# Patient Record
Sex: Male | Born: 1975 | Race: White | Hispanic: No | Marital: Married | Smoking: Current every day smoker
Health system: Southern US, Community
[De-identification: ages and names within clinical notes are randomized; demographics above are authoritative.]

## PROBLEM LIST (undated history)

## (undated) DIAGNOSIS — R059 Cough, unspecified: Secondary | ICD-10-CM

## (undated) DIAGNOSIS — Z8719 Personal history of other diseases of the digestive system: Secondary | ICD-10-CM

## (undated) DIAGNOSIS — R12 Heartburn: Secondary | ICD-10-CM

## (undated) DIAGNOSIS — M1990 Unspecified osteoarthritis, unspecified site: Secondary | ICD-10-CM

## (undated) DIAGNOSIS — R05 Cough: Secondary | ICD-10-CM

## (undated) DIAGNOSIS — R0600 Dyspnea, unspecified: Secondary | ICD-10-CM

## (undated) DIAGNOSIS — S72009A Fracture of unspecified part of neck of unspecified femur, initial encounter for closed fracture: Secondary | ICD-10-CM

## (undated) DIAGNOSIS — I10 Essential (primary) hypertension: Secondary | ICD-10-CM

## (undated) HISTORY — PX: WRIST SURGERY: SHX841

## (undated) HISTORY — PX: HERNIA REPAIR: SHX51

---

## 2017-09-15 DIAGNOSIS — M1612 Unilateral primary osteoarthritis, left hip: Secondary | ICD-10-CM | POA: Diagnosis not present

## 2017-10-27 DIAGNOSIS — M1612 Unilateral primary osteoarthritis, left hip: Secondary | ICD-10-CM | POA: Diagnosis not present

## 2017-11-03 DIAGNOSIS — R030 Elevated blood-pressure reading, without diagnosis of hypertension: Secondary | ICD-10-CM | POA: Diagnosis not present

## 2017-11-03 DIAGNOSIS — Z01818 Encounter for other preprocedural examination: Secondary | ICD-10-CM | POA: Diagnosis not present

## 2017-11-03 DIAGNOSIS — M25552 Pain in left hip: Secondary | ICD-10-CM | POA: Diagnosis not present

## 2017-11-03 DIAGNOSIS — F17200 Nicotine dependence, unspecified, uncomplicated: Secondary | ICD-10-CM | POA: Diagnosis not present

## 2017-11-17 DIAGNOSIS — M1612 Unilateral primary osteoarthritis, left hip: Secondary | ICD-10-CM | POA: Diagnosis not present

## 2017-11-18 DIAGNOSIS — M25552 Pain in left hip: Secondary | ICD-10-CM | POA: Diagnosis not present

## 2017-11-18 DIAGNOSIS — E871 Hypo-osmolality and hyponatremia: Secondary | ICD-10-CM | POA: Diagnosis not present

## 2017-11-18 DIAGNOSIS — F17200 Nicotine dependence, unspecified, uncomplicated: Secondary | ICD-10-CM | POA: Diagnosis not present

## 2017-11-18 DIAGNOSIS — I10 Essential (primary) hypertension: Secondary | ICD-10-CM | POA: Diagnosis not present

## 2017-11-23 ENCOUNTER — Ambulatory Visit: Payer: Self-pay | Admitting: Physician Assistant

## 2017-11-23 NOTE — H&P (Signed)
TOTAL HIP ADMISSION H&P  Patient is admitted for left total hip arthroplasty.  Subjective:  Chief Complaint: left hip pain  HPI: Andrew Lamb, 42 y.o. male, has a history of pain and functional disability in the left hip(s) due to arthritis and patient has failed non-surgical conservative treatments for greater than 12 weeks to include NSAID's and/or analgesics, corticosteriod injections, use of assistive devices and activity modification.  Onset of symptoms was gradual starting 5 years ago with gradually worsening course since that time.The patient noted no past surgery on the left hip(s).  Patient currently rates pain in the left hip at 10 out of 10 with activity. Patient has night pain, worsening of pain with activity and weight bearing, pain that interfers with activities of daily living and pain with passive range of motion. Patient has evidence of subchondral cysts, periarticular osteophytes and joint space narrowing by imaging studies. This condition presents safety issues increasing the risk of falls. There is no current active infection.  There are no active problems to display for this patient.  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   No current facility-administered medications for this visit.    Allergies not on file  Social History   Tobacco Use  . Smoking status: Not on file  Substance Use Topics  . Alcohol use: Not on file    No family history on file.   Review of Systems  Musculoskeletal: Positive for joint pain.  All other systems reviewed and are negative.   Objective:  Physical Exam  Constitutional: He is oriented to person, place, and time. He appears well-developed and well-nourished. No distress.  HENT:  Head: Normocephalic and atraumatic.  Nose: Nose normal.  Eyes: Pupils are equal, round, and reactive to light.  Conjunctivae and EOM are normal.  Neck: Normal range of motion. Neck supple.  Cardiovascular: Normal rate, regular rhythm, normal heart sounds and intact distal pulses.  Respiratory: Effort normal and breath sounds normal. No respiratory distress. He has no wheezes.  GI: Soft. Bowel sounds are normal. He exhibits no distension. There is no tenderness.  Musculoskeletal:       Left hip: He exhibits decreased range of motion, decreased strength, tenderness and bony tenderness.  Lymphadenopathy:    He has no cervical adenopathy.  Neurological: He is alert and oriented to person, place, and time. No cranial nerve deficit.  Skin: Skin is warm and dry. No rash noted. No erythema.  Psychiatric: He has a normal mood and affect. His behavior is normal.    Vital signs in last 24 hours: @VSRANGES @  Labs:   There is no height or weight on file to calculate BMI.   Imaging Review Plain radiographs demonstrate severe degenerative joint disease of the left hip(s). The bone quality appears to be good for age and reported activity level.    Preoperative templating of the joint replacement has been completed, documented, and submitted to the Operating Room personnel in order to optimize intra-operative equipment management.       Assessment/Plan:  End stage arthritis, left hip(s)  The patient history, physical examination, clinical judgement of the provider and imaging studies are consistent with end stage degenerative joint disease of the left hip(s) and total hip arthroplasty is deemed medically necessary. The treatment options including medical management, injection therapy, arthroscopy and arthroplasty were discussed at length. The risks and benefits  of total hip arthroplasty were presented and reviewed. The risks due to aseptic loosening, infection, stiffness, dislocation/subluxation,  thromboembolic complications and other imponderables were discussed.  The patient acknowledged the  explanation, agreed to proceed with the plan and consent was signed. Patient is being admitted for inpatient treatment for surgery, pain control, PT, OT, prophylactic antibiotics, VTE prophylaxis, progressive ambulation and ADL's and discharge planning.The patient is planning to be discharged home with home health services

## 2017-11-23 NOTE — H&P (View-Only) (Signed)
TOTAL HIP ADMISSION H&P  Patient is admitted for left total hip arthroplasty.  Subjective:  Chief Complaint: left hip pain  HPI: Andrew Lamb, 42 y.o. male, has a history of pain and functional disability in the left hip(s) due to arthritis and patient has failed non-surgical conservative treatments for greater than 12 weeks to include NSAID's and/or analgesics, corticosteriod injections, use of assistive devices and activity modification.  Onset of symptoms was gradual starting 5 years ago with gradually worsening course since that time.The patient noted no past surgery on the left hip(s).  Patient currently rates pain in the left hip at 10 out of 10 with activity. Patient has night pain, worsening of pain with activity and weight bearing, pain that interfers with activities of daily living and pain with passive range of motion. Patient has evidence of subchondral cysts, periarticular osteophytes and joint space narrowing by imaging studies. This condition presents safety issues increasing the risk of falls. There is no current active infection.  There are no active problems to display for this patient.  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   No current facility-administered medications for this visit.    Allergies not on file  Social History   Tobacco Use  . Smoking status: Not on file  Substance Use Topics  . Alcohol use: Not on file    No family history on file.   Review of Systems  Musculoskeletal: Positive for joint pain.  All other systems reviewed and are negative.   Objective:  Physical Exam  Constitutional: He is oriented to person, place, and time. He appears well-developed and well-nourished. No distress.  HENT:  Head: Normocephalic and atraumatic.  Nose: Nose normal.  Eyes: Pupils are equal, round, and reactive to light.  Conjunctivae and EOM are normal.  Neck: Normal range of motion. Neck supple.  Cardiovascular: Normal rate, regular rhythm, normal heart sounds and intact distal pulses.  Respiratory: Effort normal and breath sounds normal. No respiratory distress. He has no wheezes.  GI: Soft. Bowel sounds are normal. He exhibits no distension. There is no tenderness.  Musculoskeletal:       Left hip: He exhibits decreased range of motion, decreased strength, tenderness and bony tenderness.  Lymphadenopathy:    He has no cervical adenopathy.  Neurological: He is alert and oriented to person, place, and time. No cranial nerve deficit.  Skin: Skin is warm and dry. No rash noted. No erythema.  Psychiatric: He has a normal mood and affect. His behavior is normal.    Vital signs in last 24 hours: @VSRANGES@  Labs:   There is no height or weight on file to calculate BMI.   Imaging Review Plain radiographs demonstrate severe degenerative joint disease of the left hip(s). The bone quality appears to be good for age and reported activity level.    Preoperative templating of the joint replacement has been completed, documented, and submitted to the Operating Room personnel in order to optimize intra-operative equipment management.       Assessment/Plan:  End stage arthritis, left hip(s)  The patient history, physical examination, clinical judgement of the provider and imaging studies are consistent with end stage degenerative joint disease of the left hip(s) and total hip arthroplasty is deemed medically necessary. The treatment options including medical management, injection therapy, arthroscopy and arthroplasty were discussed at length. The risks and benefits   of total hip arthroplasty were presented and reviewed. The risks due to aseptic loosening, infection, stiffness, dislocation/subluxation,  thromboembolic complications and other imponderables were discussed.  The patient acknowledged the  explanation, agreed to proceed with the plan and consent was signed. Patient is being admitted for inpatient treatment for surgery, pain control, PT, OT, prophylactic antibiotics, VTE prophylaxis, progressive ambulation and ADL's and discharge planning.The patient is planning to be discharged home with home health services 

## 2017-11-27 NOTE — Pre-Procedure Instructions (Signed)
Gaye AlkenMartin Splawn  11/27/2017     No Pharmacies Listed   Your procedure is scheduled on 12-11-2017 Friday  .  [facility] at 8:15 A.M.   Call this number if you have problems the morning of surgery:  [phone]   Remember:  Do not eat food or drink liquids after midnight.   Take these medicines the morning of surgery with A SIP OF WATER  None    STOP TAKING ANY ASPIRIN(UNLESS OTHERWISE INSTRUCTED BY YOUR SURGEON),ANTIINFLAMATORIES (IBUPROFEN,ALEVE,MOTRIN,ADVIL,GOODY'S POWDERS),HERBAL SUPPLEMENTS,FISH OIL,AND VITAMINS 5-7 DAYS PRIOR TO SURGERY   Do not wear jewelry, make-up or nail polish.  Do not wear lotions, powders, or perfumes, or deodorant.  Do not shave 48 hours prior to surgery.     Do not bring valuables to the hospital.   [facility] any belongings or valuables.  Contacts, dentures or bridgework may not be worn into surgery.  Leave your suitcase in the car.  After surgery it may be brought to your room.  For patients admitted to the hospital, discharge time will be determined by your treatment team.  Patients discharged the day of surgery will not be allowed to drive home.    Special Instructions: [place] - Preparing for Surgery  Before surgery, you can play an important role.  Because skin is not sterile, your skin needs to be as free of germs as possible.  You can reduce the number of germs on you skin by washing with CHG (chlorahexidine gluconate) soap before surgery.  CHG is an antiseptic cleaner which kills germs and bonds with the skin to continue killing germs even after washing.  Please DO NOT use if you have an allergy to CHG or antibacterial soaps.  If your skin becomes reddened/irritated stop using the CHG and inform your nurse when you arrive at Short Stay.  Do not shave (including legs and underarms) for at least 48 hours prior to the first CHG shower.  You may shave your face.  Please follow  these instructions carefully:   1.  Shower with CHG Soap the night before surgery and the   morning of Surgery.  2.  If you choose to wash your hair, wash your hair first as usual with your normal shampoo.  3.  After you shampoo, rinse your hair and body thoroughly to remove the  Shampoo.  4.  Use CHG as you would any other liquid soap.  You can apply chg directly  to the skin and wash gently with scrungie or a clean washcloth.  5.  Apply the CHG Soap to your body ONLY FROM THE NECK DOWN.   Do not use on open wounds or open sores.  Avoid contact with your eyes,  ears, mouth and genitals (private parts).  Wash genitals (private parts) with your normal soap.  6.  Wash thoroughly, paying special attention to the area where your surgery will be performed.  7.  Thoroughly rinse your body with warm water from the neck down.  8.  DO NOT shower/wash with your normal soap after using and rinsing o  the CHG Soap.  9.  Pat yourself dry with a clean towel.            10.  Wear clean pajamas.            11.  Place clean sheets on your bed the night of your first shower and do not sleep with pets.  Day  of Surgery  Do not apply any lotions/deodorants the morning of surgery.  Please wear clean clothes to the hospital/surgery center. Special Instructions: [place] - Preparing for Surgery  Before surgery, you can play an important role.  Because skin is not sterile, your skin needs to be as free of germs as possible.  You can reduce the number of germs on you skin by washing with CHG (chlorahexidine gluconate) soap before surgery.  CHG is an antiseptic cleaner which kills germs and bonds with the skin to continue killing germs even after washing.  Please DO NOT use if you have an allergy to CHG or antibacterial soaps.  If your skin becomes reddened/irritated stop using the CHG and inform your nurse when you arrive at Short Stay.  Do not shave (including legs and underarms) for at least 48 hours prior to the  first CHG shower.  You may shave your face.  Please follow these instructions carefully:   1.  Shower with CHG Soap the night before surgery and the   morning of Surgery.  2.  If you choose to wash your hair, wash your hair first as usual with your normal shampoo.  3.  After you shampoo, rinse your hair and body thoroughly to remove the  Shampoo.  4.  Use CHG as you would any other liquid soap.  You can apply chg directly  to the skin and wash gently with scrungie or a clean washcloth.  5.  Apply the CHG Soap to your body ONLY FROM THE NECK DOWN.   Do not use on open wounds or open sores.  Avoid contact with your eyes,  ears, mouth and genitals (private parts).  Wash genitals (private parts) with your normal soap.  6.  Wash thoroughly, paying special attention to the area where your surgery will be performed.  7.  Thoroughly rinse your body with warm water from the neck down.  8.  DO NOT shower/wash with your normal soap after using and rinsing o  the CHG Soap.  9.  Pat yourself dry with a clean towel.            10.  Wear clean pajamas.            11.  Place clean sheets on your bed the night of your first shower and do not sleep with pets.  Day of Surgery  Do not apply any lotions/deodorants the morning of surgery.  Please wear clean clothes to the hospital/surgery center.   Please read over the following fact sheets that you were given. Coughing and Deep Breathing, MRSA Information and Surgical Site Infection Prevention. Incentive Spirometry

## 2017-11-30 ENCOUNTER — Encounter (HOSPITAL_COMMUNITY): Payer: Self-pay

## 2017-11-30 ENCOUNTER — Encounter (HOSPITAL_COMMUNITY)
Admission: RE | Admit: 2017-11-30 | Discharge: 2017-11-30 | Disposition: A | Discharge status: Home / Self Care | Payer: BLUE CROSS/BLUE SHIELD | Source: Ambulatory Visit | Attending: Orthopedic Surgery | Admitting: Orthopedic Surgery

## 2017-11-30 DIAGNOSIS — Z01812 Encounter for preprocedural laboratory examination: Secondary | ICD-10-CM | POA: Insufficient documentation

## 2017-11-30 HISTORY — DX: Cough, unspecified: R05.9

## 2017-11-30 HISTORY — DX: Dyspnea, unspecified: R06.00

## 2017-11-30 HISTORY — DX: Cough: R05

## 2017-11-30 HISTORY — DX: Fracture of unspecified part of neck of unspecified femur, initial encounter for closed fracture: S72.009A

## 2017-11-30 HISTORY — DX: Essential (primary) hypertension: I10

## 2017-11-30 LAB — COMPREHENSIVE METABOLIC PANEL
ALT: 19 U/L (ref 17–63)
ANION GAP: 11 (ref 5–15)
AST: 30 U/L (ref 15–41)
Albumin: 4.2 g/dL (ref 3.5–5.0)
Alkaline Phosphatase: 94 U/L (ref 38–126)
BILIRUBIN TOTAL: 0.9 mg/dL (ref 0.3–1.2)
BUN: 9 mg/dL (ref 6–20)
CHLORIDE: 98 mmol/L — AB (ref 101–111)
CO2: 25 mmol/L (ref 22–32)
Calcium: 9.3 mg/dL (ref 8.9–10.3)
Creatinine, Ser: 0.84 mg/dL (ref 0.61–1.24)
Glucose, Bld: 111 mg/dL — ABNORMAL HIGH (ref 65–99)
POTASSIUM: 3.5 mmol/L (ref 3.5–5.1)
Sodium: 134 mmol/L — ABNORMAL LOW (ref 135–145)
TOTAL PROTEIN: 7.8 g/dL (ref 6.5–8.1)

## 2017-11-30 LAB — TYPE AND SCREEN
ABO/RH(D): A POS
Antibody Screen: NEGATIVE

## 2017-11-30 LAB — CBC WITH DIFFERENTIAL/PLATELET
Basophils Absolute: 0 10*3/uL (ref 0.0–0.1)
Basophils Relative: 0 %
EOS PCT: 1 %
Eosinophils Absolute: 0 10*3/uL (ref 0.0–0.7)
HEMATOCRIT: 45.1 % (ref 39.0–52.0)
Hemoglobin: 16 g/dL (ref 13.0–17.0)
LYMPHS PCT: 24 %
Lymphs Abs: 1.5 10*3/uL (ref 0.7–4.0)
MCH: 34 pg (ref 26.0–34.0)
MCHC: 35.5 g/dL (ref 30.0–36.0)
MCV: 95.8 fL (ref 78.0–100.0)
MONO ABS: 0.5 10*3/uL (ref 0.1–1.0)
MONOS PCT: 8 %
NEUTROS ABS: 4.1 10*3/uL (ref 1.7–7.7)
NEUTROS PCT: 67 %
PLATELETS: 214 10*3/uL (ref 150–400)
RBC: 4.71 MIL/uL (ref 4.22–5.81)
RDW: 13.6 % (ref 11.5–15.5)
WBC: 6.2 10*3/uL (ref 4.0–10.5)

## 2017-11-30 LAB — APTT: aPTT: 29 seconds (ref 24–36)

## 2017-11-30 LAB — URINALYSIS, ROUTINE W REFLEX MICROSCOPIC
Glucose, UA: NEGATIVE mg/dL
HGB URINE DIPSTICK: NEGATIVE
KETONES UR: 5 mg/dL — AB
Leukocytes, UA: NEGATIVE
Nitrite: NEGATIVE
PH: 5 (ref 5.0–8.0)
Protein, ur: 100 mg/dL — AB
Specific Gravity, Urine: 1.026 (ref 1.005–1.030)

## 2017-11-30 LAB — PROTIME-INR
INR: 0.91
Prothrombin Time: 12.2 seconds (ref 11.4–15.2)

## 2017-11-30 LAB — ABO/RH: ABO/RH(D): A POS

## 2017-11-30 LAB — SURGICAL PCR SCREEN
MRSA, PCR: NEGATIVE
Staphylococcus aureus: POSITIVE — AB

## 2017-12-01 LAB — URINE CULTURE: Culture: NO GROWTH

## 2017-12-10 MED ORDER — BUPIVACAINE LIPOSOME 1.3 % IJ SUSP
20.0000 mL | INTRAMUSCULAR | Status: DC
  Filled 2017-12-10: qty 20

## 2017-12-10 MED ORDER — SODIUM CHLORIDE 0.9 % IV SOLN: INTRAVENOUS | Status: DC

## 2017-12-10 MED ORDER — TRANEXAMIC ACID 1000 MG/10ML IV SOLN
1000.0000 mg | INTRAVENOUS | Status: AC
  Administered 2017-12-11: 1000 mg via INTRAVENOUS
  Filled 2017-12-10: qty 1100

## 2017-12-11 ENCOUNTER — Inpatient Hospital Stay (HOSPITAL_COMMUNITY): Payer: BLUE CROSS/BLUE SHIELD

## 2017-12-11 ENCOUNTER — Encounter (HOSPITAL_COMMUNITY): Payer: Self-pay

## 2017-12-11 ENCOUNTER — Inpatient Hospital Stay (HOSPITAL_COMMUNITY): Payer: BLUE CROSS/BLUE SHIELD | Admitting: Anesthesiology

## 2017-12-11 ENCOUNTER — Inpatient Hospital Stay (HOSPITAL_COMMUNITY): Payer: BLUE CROSS/BLUE SHIELD | Admitting: Emergency Medicine

## 2017-12-11 ENCOUNTER — Inpatient Hospital Stay (HOSPITAL_COMMUNITY)
Admission: RE | Admit: 2017-12-11 | Discharge: 2017-12-12 | DRG: 470 | Disposition: A | Discharge status: Home Health | Payer: BLUE CROSS/BLUE SHIELD | Source: Ambulatory Visit | Attending: Orthopedic Surgery | Admitting: Orthopedic Surgery

## 2017-12-11 ENCOUNTER — Encounter (HOSPITAL_COMMUNITY)
Admission: RE | Disposition: A | Discharge status: Home Health | Payer: Self-pay | Source: Ambulatory Visit | Attending: Orthopedic Surgery

## 2017-12-11 DIAGNOSIS — M1612 Unilateral primary osteoarthritis, left hip: Principal | ICD-10-CM | POA: Diagnosis present

## 2017-12-11 DIAGNOSIS — I10 Essential (primary) hypertension: Secondary | ICD-10-CM | POA: Diagnosis not present

## 2017-12-11 DIAGNOSIS — Z471 Aftercare following joint replacement surgery: Secondary | ICD-10-CM | POA: Diagnosis not present

## 2017-12-11 DIAGNOSIS — Z880 Allergy status to penicillin: Secondary | ICD-10-CM

## 2017-12-11 DIAGNOSIS — F17200 Nicotine dependence, unspecified, uncomplicated: Secondary | ICD-10-CM | POA: Diagnosis present

## 2017-12-11 DIAGNOSIS — R269 Unspecified abnormalities of gait and mobility: Secondary | ICD-10-CM | POA: Diagnosis not present

## 2017-12-11 DIAGNOSIS — M25552 Pain in left hip: Secondary | ICD-10-CM | POA: Diagnosis not present

## 2017-12-11 DIAGNOSIS — M879 Osteonecrosis, unspecified: Secondary | ICD-10-CM | POA: Diagnosis not present

## 2017-12-11 DIAGNOSIS — M6588 Other synovitis and tenosynovitis, other site: Secondary | ICD-10-CM | POA: Diagnosis not present

## 2017-12-11 DIAGNOSIS — Z96642 Presence of left artificial hip joint: Secondary | ICD-10-CM | POA: Diagnosis not present

## 2017-12-11 DIAGNOSIS — M87052 Idiopathic aseptic necrosis of left femur: Secondary | ICD-10-CM | POA: Diagnosis not present

## 2017-12-11 HISTORY — PX: TOTAL HIP ARTHROPLASTY: SHX124

## 2017-12-11 SURGERY — ARTHROPLASTY, HIP, TOTAL,POSTERIOR APPROACH
Anesthesia: General | Site: Hip | Laterality: Left

## 2017-12-11 MED ORDER — FLEET ENEMA 7-19 GM/118ML RE ENEM: 1.0000 | ENEMA | Freq: Once | RECTAL | Status: DC | PRN

## 2017-12-11 MED ORDER — ONDANSETRON HCL 4 MG/2ML IJ SOLN
INTRAMUSCULAR | Status: DC | PRN
  Administered 2017-12-11: 4 mg via INTRAVENOUS

## 2017-12-11 MED ORDER — SODIUM CHLORIDE 0.9 % IV SOLN
INTRAVENOUS | Status: DC
  Administered 2017-12-11: 18:00:00 via INTRAVENOUS

## 2017-12-11 MED ORDER — HYDROMORPHONE HCL 2 MG/ML IJ SOLN
0.2500 mg | INTRAMUSCULAR | Status: DC | PRN
  Administered 2017-12-11 (×4): 0.5 mg via INTRAVENOUS

## 2017-12-11 MED ORDER — PROPOFOL 500 MG/50ML IV EMUL
INTRAVENOUS | Status: DC | PRN
  Administered 2017-12-11: 25 ug/kg/min via INTRAVENOUS

## 2017-12-11 MED ORDER — AMLODIPINE BESYLATE 10 MG PO TABS
10.0000 mg | ORAL_TABLET | Freq: Every day | ORAL | Status: DC
  Administered 2017-12-12: 10 mg via ORAL
  Filled 2017-12-11: qty 1

## 2017-12-11 MED ORDER — VANCOMYCIN HCL IN DEXTROSE 1-5 GM/200ML-% IV SOLN
INTRAVENOUS | Status: AC
  Administered 2017-12-11: 1000 mg via INTRAVENOUS
  Filled 2017-12-11: qty 200

## 2017-12-11 MED ORDER — FENTANYL CITRATE (PF) 250 MCG/5ML IJ SOLN
INTRAMUSCULAR | Status: AC
  Filled 2017-12-11: qty 5

## 2017-12-11 MED ORDER — LIDOCAINE HCL (CARDIAC) PF 100 MG/5ML IV SOSY
PREFILLED_SYRINGE | INTRAVENOUS | Status: DC | PRN
  Administered 2017-12-11: 100 mg via INTRAVENOUS

## 2017-12-11 MED ORDER — OXYCODONE HCL 5 MG PO TABS
5.0000 mg | ORAL_TABLET | ORAL | Status: DC | PRN
  Administered 2017-12-11 – 2017-12-12 (×6): 10 mg via ORAL
  Filled 2017-12-11 (×5): qty 2

## 2017-12-11 MED ORDER — SUGAMMADEX SODIUM 200 MG/2ML IV SOLN
INTRAVENOUS | Status: AC
  Filled 2017-12-11: qty 2

## 2017-12-11 MED ORDER — BUPIVACAINE LIPOSOME 1.3 % IJ SUSP
INTRAMUSCULAR | Status: DC | PRN
  Administered 2017-12-11: 20 mL

## 2017-12-11 MED ORDER — ONDANSETRON HCL 4 MG/2ML IJ SOLN: 4.0000 mg | Freq: Four times a day (QID) | INTRAMUSCULAR | Status: DC | PRN

## 2017-12-11 MED ORDER — ASPIRIN EC 325 MG PO TBEC
325.0000 mg | DELAYED_RELEASE_TABLET | Freq: Two times a day (BID) | ORAL | Status: DC
  Administered 2017-12-12: 325 mg via ORAL
  Filled 2017-12-11: qty 1

## 2017-12-11 MED ORDER — PHENOL 1.4 % MT LIQD: 1.0000 | OROMUCOSAL | Status: DC | PRN

## 2017-12-11 MED ORDER — SORBITOL 70 % SOLN: 30.0000 mL | Freq: Every day | Status: DC | PRN

## 2017-12-11 MED ORDER — BUPIVACAINE-EPINEPHRINE 0.5% -1:200000 IJ SOLN
INTRAMUSCULAR | Status: DC | PRN
  Administered 2017-12-11: 20 mL

## 2017-12-11 MED ORDER — METHOCARBAMOL 500 MG PO TABS
ORAL_TABLET | ORAL | Status: AC
  Administered 2017-12-11: 500 mg via ORAL
  Filled 2017-12-11: qty 1

## 2017-12-11 MED ORDER — TRANEXAMIC ACID 1000 MG/10ML IV SOLN
2000.0000 mg | INTRAVENOUS | Status: AC
  Administered 2017-12-11: 2000 mg via TOPICAL
  Filled 2017-12-11: qty 20

## 2017-12-11 MED ORDER — SENNOSIDES-DOCUSATE SODIUM 8.6-50 MG PO TABS: 1.0000 | ORAL_TABLET | Freq: Every evening | ORAL | Status: DC | PRN

## 2017-12-11 MED ORDER — SODIUM CHLORIDE 0.9 % IR SOLN
Status: DC | PRN
  Administered 2017-12-11: 3000 mL

## 2017-12-11 MED ORDER — DOCUSATE SODIUM 100 MG PO CAPS
100.0000 mg | ORAL_CAPSULE | Freq: Two times a day (BID) | ORAL | Status: DC
  Administered 2017-12-11 – 2017-12-12 (×2): 100 mg via ORAL
  Filled 2017-12-11 (×2): qty 1

## 2017-12-11 MED ORDER — OXYCODONE HCL 5 MG PO TABS
ORAL_TABLET | ORAL | Status: AC
  Administered 2017-12-11: 10 mg via ORAL
  Filled 2017-12-11: qty 2

## 2017-12-11 MED ORDER — LACTATED RINGERS IV SOLN
INTRAVENOUS | Status: DC
  Administered 2017-12-11 (×2): via INTRAVENOUS

## 2017-12-11 MED ORDER — DIPHENHYDRAMINE HCL 12.5 MG/5ML PO ELIX: 12.5000 mg | ORAL_SOLUTION | ORAL | Status: DC | PRN

## 2017-12-11 MED ORDER — ACETAMINOPHEN 500 MG PO TABS
1000.0000 mg | ORAL_TABLET | Freq: Four times a day (QID) | ORAL | Status: DC
  Administered 2017-12-11 – 2017-12-12 (×3): 1000 mg via ORAL
  Filled 2017-12-11 (×3): qty 2

## 2017-12-11 MED ORDER — CHLORHEXIDINE GLUCONATE 4 % EX LIQD: 60.0000 mL | Freq: Once | CUTANEOUS | Status: DC

## 2017-12-11 MED ORDER — HYDROMORPHONE HCL 1 MG/ML IJ SOLN: 0.5000 mg | INTRAMUSCULAR | Status: DC | PRN

## 2017-12-11 MED ORDER — ONDANSETRON HCL 4 MG PO TABS: 4.0000 mg | ORAL_TABLET | Freq: Four times a day (QID) | ORAL | Status: DC | PRN

## 2017-12-11 MED ORDER — SUGAMMADEX SODIUM 200 MG/2ML IV SOLN
INTRAVENOUS | Status: DC | PRN
  Administered 2017-12-11: 200 mg via INTRAVENOUS

## 2017-12-11 MED ORDER — FENTANYL CITRATE (PF) 100 MCG/2ML IJ SOLN
INTRAMUSCULAR | Status: DC | PRN
  Administered 2017-12-11: 150 ug via INTRAVENOUS
  Administered 2017-12-11 (×4): 50 ug via INTRAVENOUS
  Administered 2017-12-11: 100 ug via INTRAVENOUS
  Administered 2017-12-11: 50 ug via INTRAVENOUS

## 2017-12-11 MED ORDER — METHOCARBAMOL 500 MG PO TABS
500.0000 mg | ORAL_TABLET | Freq: Four times a day (QID) | ORAL | Status: DC | PRN
  Administered 2017-12-11 – 2017-12-12 (×3): 500 mg via ORAL
  Filled 2017-12-11 (×2): qty 1

## 2017-12-11 MED ORDER — ACETAMINOPHEN 325 MG PO TABS: 325.0000 mg | ORAL_TABLET | Freq: Four times a day (QID) | ORAL | Status: DC | PRN

## 2017-12-11 MED ORDER — TRANEXAMIC ACID 1000 MG/10ML IV SOLN
1000.0000 mg | Freq: Once | INTRAVENOUS | Status: AC
  Administered 2017-12-11: 1000 mg via INTRAVENOUS
  Filled 2017-12-11: qty 10

## 2017-12-11 MED ORDER — VANCOMYCIN HCL IN DEXTROSE 1-5 GM/200ML-% IV SOLN
1000.0000 mg | Freq: Two times a day (BID) | INTRAVENOUS | Status: AC
  Administered 2017-12-11: 1000 mg via INTRAVENOUS
  Filled 2017-12-11: qty 200

## 2017-12-11 MED ORDER — OXYCODONE-ACETAMINOPHEN 5-325 MG PO TABS: 1.0000 | ORAL_TABLET | ORAL | 0 refills | Status: DC | PRN

## 2017-12-11 MED ORDER — MENTHOL 3 MG MT LOZG: 1.0000 | LOZENGE | OROMUCOSAL | Status: DC | PRN

## 2017-12-11 MED ORDER — CEFAZOLIN SODIUM-DEXTROSE 2-4 GM/100ML-% IV SOLN: 2.0000 g | INTRAVENOUS | Status: DC

## 2017-12-11 MED ORDER — DEXAMETHASONE SODIUM PHOSPHATE 10 MG/ML IJ SOLN
INTRAMUSCULAR | Status: DC | PRN
  Administered 2017-12-11: 10 mg via INTRAVENOUS

## 2017-12-11 MED ORDER — METOCLOPRAMIDE HCL 5 MG PO TABS: 5.0000 mg | ORAL_TABLET | Freq: Three times a day (TID) | ORAL | Status: DC | PRN

## 2017-12-11 MED ORDER — ONDANSETRON HCL 4 MG/2ML IJ SOLN
INTRAMUSCULAR | Status: AC
  Filled 2017-12-11: qty 2

## 2017-12-11 MED ORDER — BUPIVACAINE-EPINEPHRINE (PF) 0.5% -1:200000 IJ SOLN
INTRAMUSCULAR | Status: AC
  Filled 2017-12-11: qty 30

## 2017-12-11 MED ORDER — VANCOMYCIN HCL IN DEXTROSE 1-5 GM/200ML-% IV SOLN
1000.0000 mg | Freq: Once | INTRAVENOUS | Status: AC
  Administered 2017-12-11 (×2): 1000 mg via INTRAVENOUS

## 2017-12-11 MED ORDER — SPIRITUS FRUMENTI
5.0000 | Freq: Every day | ORAL | Status: DC
  Administered 2017-12-11: 6 via ORAL
  Filled 2017-12-11: qty 6

## 2017-12-11 MED ORDER — ROCURONIUM BROMIDE 100 MG/10ML IV SOLN
INTRAVENOUS | Status: DC | PRN
  Administered 2017-12-11: 20 mg via INTRAVENOUS
  Administered 2017-12-11: 50 mg via INTRAVENOUS
  Administered 2017-12-11: 30 mg via INTRAVENOUS

## 2017-12-11 MED ORDER — ASPIRIN EC 325 MG PO TBEC: 325.0000 mg | DELAYED_RELEASE_TABLET | Freq: Two times a day (BID) | ORAL | 0 refills | Status: AC

## 2017-12-11 MED ORDER — DEXAMETHASONE SODIUM PHOSPHATE 10 MG/ML IJ SOLN
INTRAMUSCULAR | Status: AC
  Filled 2017-12-11: qty 1

## 2017-12-11 MED ORDER — HYDROMORPHONE HCL 2 MG/ML IJ SOLN
INTRAMUSCULAR | Status: AC
  Administered 2017-12-11: 0.5 mg via INTRAVENOUS
  Filled 2017-12-11: qty 1

## 2017-12-11 MED ORDER — ROCURONIUM BROMIDE 10 MG/ML (PF) SYRINGE
PREFILLED_SYRINGE | INTRAVENOUS | Status: AC
  Filled 2017-12-11: qty 5

## 2017-12-11 MED ORDER — METHOCARBAMOL 500 MG PO TABS: 500.0000 mg | ORAL_TABLET | Freq: Four times a day (QID) | ORAL | 0 refills | Status: DC | PRN

## 2017-12-11 MED ORDER — MIDAZOLAM HCL 2 MG/2ML IJ SOLN
INTRAMUSCULAR | Status: AC
  Filled 2017-12-11: qty 2

## 2017-12-11 MED ORDER — MIDAZOLAM HCL 5 MG/5ML IJ SOLN
INTRAMUSCULAR | Status: DC | PRN
  Administered 2017-12-11: 2 mg via INTRAVENOUS

## 2017-12-11 MED ORDER — PROPOFOL 10 MG/ML IV BOLUS
INTRAVENOUS | Status: AC
  Filled 2017-12-11: qty 20

## 2017-12-11 MED ORDER — METOCLOPRAMIDE HCL 5 MG/ML IJ SOLN: 5.0000 mg | Freq: Three times a day (TID) | INTRAMUSCULAR | Status: DC | PRN

## 2017-12-11 MED ORDER — LIDOCAINE 2% (20 MG/ML) 5 ML SYRINGE
INTRAMUSCULAR | Status: AC
  Filled 2017-12-11: qty 5

## 2017-12-11 MED ORDER — PROPOFOL 10 MG/ML IV BOLUS
INTRAVENOUS | Status: DC | PRN
  Administered 2017-12-11: 200 mg via INTRAVENOUS

## 2017-12-11 MED ORDER — HYDROMORPHONE HCL 2 MG/ML IJ SOLN
0.5000 mg | INTRAMUSCULAR | Status: DC | PRN
  Administered 2017-12-11 – 2017-12-12 (×2): 1 mg via INTRAVENOUS
  Filled 2017-12-11 (×2): qty 1

## 2017-12-11 SURGICAL SUPPLY — 56 items
BLADE SAW SGTL 73X25 THK (BLADE) ×2 IMPLANT
BRUSH FEMORAL CANAL (MISCELLANEOUS) IMPLANT
CAPT HIP TOTAL 2 ×2 IMPLANT
COVER SURGICAL LIGHT HANDLE (MISCELLANEOUS) ×2 IMPLANT
DRAPE INCISE IOBAN 66X45 STRL (DRAPES) IMPLANT
DRAPE ORTHO SPLIT 77X108 STRL (DRAPES) ×2
DRAPE SURG ORHT 6 SPLT 77X108 (DRAPES) ×2 IMPLANT
DRAPE U-SHAPE 47X51 STRL (DRAPES) ×2 IMPLANT
DRSG ADAPTIC 3X8 NADH LF (GAUZE/BANDAGES/DRESSINGS) ×2 IMPLANT
DRSG PAD ABDOMINAL 8X10 ST (GAUZE/BANDAGES/DRESSINGS) ×4 IMPLANT
DURAPREP 26ML APPLICATOR (WOUND CARE) ×2 IMPLANT
ELECT BLADE 6.5 EXT (BLADE) IMPLANT
ELECT CAUTERY BLADE 6.4 (BLADE) ×2 IMPLANT
ELECT REM PT RETURN 9FT ADLT (ELECTROSURGICAL) ×2
ELECTRODE REM PT RTRN 9FT ADLT (ELECTROSURGICAL) ×1 IMPLANT
FACESHIELD WRAPAROUND (MASK) ×4 IMPLANT
GAUZE SPONGE 4X4 12PLY STRL (GAUZE/BANDAGES/DRESSINGS) ×2 IMPLANT
GLOVE BIOGEL PI IND STRL 8 (GLOVE) ×2 IMPLANT
GLOVE BIOGEL PI INDICATOR 8 (GLOVE) ×2
GLOVE ORTHO TXT STRL SZ7.5 (GLOVE) ×4 IMPLANT
GLOVE SURG ORTHO 8.0 STRL STRW (GLOVE) ×4 IMPLANT
GOWN STRL REUS W/ TWL LRG LVL3 (GOWN DISPOSABLE) ×1 IMPLANT
GOWN STRL REUS W/ TWL XL LVL3 (GOWN DISPOSABLE) ×1 IMPLANT
GOWN STRL REUS W/TWL 2XL LVL3 (GOWN DISPOSABLE) ×2 IMPLANT
GOWN STRL REUS W/TWL LRG LVL3 (GOWN DISPOSABLE) ×1
GOWN STRL REUS W/TWL XL LVL3 (GOWN DISPOSABLE) ×1
HANDPIECE INTERPULSE COAX TIP (DISPOSABLE)
HOOD PEEL AWAY FACE SHEILD DIS (HOOD) ×2 IMPLANT
IMMOBILIZER KNEE 22 UNIV (SOFTGOODS) ×2 IMPLANT
KIT BASIN OR (CUSTOM PROCEDURE TRAY) ×2 IMPLANT
KIT TURNOVER KIT B (KITS) ×2 IMPLANT
MANIFOLD NEPTUNE II (INSTRUMENTS) ×2 IMPLANT
NEEDLE 22X1 1/2 (OR ONLY) (NEEDLE) ×4 IMPLANT
NEEDLE MAYO TROCAR (NEEDLE) IMPLANT
NS IRRIG 1000ML POUR BTL (IV SOLUTION) ×2 IMPLANT
PACK TOTAL JOINT (CUSTOM PROCEDURE TRAY) ×2 IMPLANT
PACK UNIVERSAL I (CUSTOM PROCEDURE TRAY) ×2 IMPLANT
PAD ABD 8X10 STRL (GAUZE/BANDAGES/DRESSINGS) ×2 IMPLANT
PAD ARMBOARD 7.5X6 YLW CONV (MISCELLANEOUS) ×4 IMPLANT
PRESSURIZER FEMORAL UNIV (MISCELLANEOUS) IMPLANT
SET HNDPC FAN SPRY TIP SCT (DISPOSABLE) IMPLANT
STAPLER VISISTAT 35W (STAPLE) ×2 IMPLANT
SUCTION FRAZIER HANDLE 10FR (MISCELLANEOUS) ×1
SUCTION TUBE FRAZIER 10FR DISP (MISCELLANEOUS) ×1 IMPLANT
SUT ETHIBOND 2 V 37 (SUTURE) ×2 IMPLANT
SUT VIC AB 0 CT1 27 (SUTURE) ×1
SUT VIC AB 0 CT1 27XBRD ANBCTR (SUTURE) ×1 IMPLANT
SUT VIC AB 2-0 CT1 27 (SUTURE) ×2
SUT VIC AB 2-0 CT1 TAPERPNT 27 (SUTURE) ×2 IMPLANT
SYR CONTROL 10ML LL (SYRINGE) ×4 IMPLANT
TAPE CLOTH SURG 4X10 WHT LF (GAUZE/BANDAGES/DRESSINGS) ×2 IMPLANT
TOWEL OR 17X24 6PK STRL BLUE (TOWEL DISPOSABLE) ×2 IMPLANT
TOWEL OR 17X26 10 PK STRL BLUE (TOWEL DISPOSABLE) ×2 IMPLANT
TOWER CARTRIDGE SMART MIX (DISPOSABLE) IMPLANT
TRAY CATH 16FR W/PLASTIC CATH (SET/KITS/TRAYS/PACK) IMPLANT
WATER STERILE IRR 1000ML POUR (IV SOLUTION) ×2 IMPLANT

## 2017-12-11 NOTE — Brief Op Note (Signed)
12/11/2017  12:10 PM  PATIENT:  Andrew Lamb  42 y.o. male  PRE-OPERATIVE DIAGNOSIS:  OSTEOARTHRITIS AVASCULAR NECROSIS LEFT HIP  POST-OPERATIVE DIAGNOSIS:  OSTEOARTHRITIS AVASCULAR NECROSIS LEFT HIP  PROCEDURE:  Procedure(s): LEFT TOTAL HIP ARTHROPLASTY (Left)  SURGEON:  Surgeon(s) and Role:    Frederico Hamman* Caffrey, Daniel, MD - Primary  PHYSICIAN ASSISTANT: Margart SicklesJoshua Erasto Sleight, PA-C  ASSISTANTS: OR staff x1   ANESTHESIA:   local and general  EBL:  400 mL   BLOOD ADMINISTERED:none  DRAINS: none   LOCAL MEDICATIONS USED:  MARCAINE     SPECIMEN:  No Specimen  DISPOSITION OF SPECIMEN:  N/A  COUNTS:  YES  TOURNIQUET:  * No tourniquets in log *  DICTATION: .Other Dictation: Dictation Number unknown  PLAN OF CARE: Admit to inpatient   PATIENT DISPOSITION:  PACU - hemodynamically stable.   Delay start of Pharmacological VTE agent (>24hrs) due to surgical blood loss or risk of bleeding: yes

## 2017-12-11 NOTE — Progress Notes (Signed)
Dr. Chilton SiGreen made aware of patient's BP.  No new orders received.

## 2017-12-11 NOTE — Anesthesia Procedure Notes (Signed)
Procedure Name: Intubation Date/Time: 12/11/2017 10:21 AM Performed by: Lovie Cholock, Sharifa Bucholz K, CRNA Pre-anesthesia Checklist: Patient identified, Emergency Drugs available, Suction available and Patient being monitored Patient Re-evaluated:Patient Re-evaluated prior to induction Oxygen Delivery Method: Circle System Utilized Preoxygenation: Pre-oxygenation with 100% oxygen Induction Type: IV induction Ventilation: Mask ventilation without difficulty and Oral airway inserted - appropriate to patient size Laryngoscope Size: Glidescope and 4 Grade View: Grade I Tube type: Oral Tube size: 7.5 mm Number of attempts: 1 Airway Equipment and Method: Stylet,  Oral airway and Video-laryngoscopy Placement Confirmation: ETT inserted through vocal cords under direct vision,  positive ETCO2 and breath sounds checked- equal and bilateral Secured at: 22 cm Tube secured with: Tape Dental Injury: Teeth and Oropharynx as per pre-operative assessment  Comments: Elective glidescope. Patients states his jaw "pops" open if he yawns and then it gets stuck in an open position. He has to move and push his jaw on the left hinge to get it to close.

## 2017-12-11 NOTE — Anesthesia Preprocedure Evaluation (Addendum)
Anesthesia Evaluation  Patient identified by MRN, date of birth, ID band Patient awake    Reviewed: Allergy & Precautions, NPO status , Patient's Chart, lab work & pertinent test results  Airway Mallampati: II  TM Distance: >3 FB Neck ROM: Full  Mouth opening: Limited Mouth Opening  Dental  (+) Teeth Intact, Caps, Dental Advisory Given   Pulmonary shortness of breath, Current Smoker,    breath sounds clear to auscultation       Cardiovascular hypertension,  Rhythm:Regular Rate:Normal     Neuro/Psych    GI/Hepatic negative GI ROS, Neg liver ROS, (+)     substance abuse  alcohol use, Patient states he drinks at least 6 beers daily during the week and up to a 12 pack daily on the weekends.  Patient also uses CBD gummies at night for sleep.   Endo/Other  negative endocrine ROS  Renal/GU negative Renal ROS     Musculoskeletal   Abdominal   Peds  Hematology   Anesthesia Other Findings   Reproductive/Obstetrics                            Anesthesia Physical Anesthesia Plan  ASA: III  Anesthesia Plan: General   Post-op Pain Management:    Induction: Intravenous  PONV Risk Score and Plan: Treatment may vary due to age or medical condition, Dexamethasone, Ondansetron and Midazolam  Airway Management Planned: Oral ETT and Video Laryngoscope Planned  Additional Equipment:   Intra-op Plan:   Post-operative Plan: Extubation in OR  Informed Consent: I have reviewed the patients History and Physical, chart, labs and discussed the procedure including the risks, benefits and alternatives for the proposed anesthesia with the patient or authorized representative who has indicated his/her understanding and acceptance.   Dental advisory given  Plan Discussed with: CRNA and Anesthesiologist  Anesthesia Plan Comments:        Anesthesia Quick Evaluation

## 2017-12-11 NOTE — Anesthesia Postprocedure Evaluation (Signed)
Anesthesia Post Note  Patient: Andrew Lamb  Procedure(s) Performed: LEFT TOTAL HIP ARTHROPLASTY (Left Hip)     Patient location during evaluation: PACU Anesthesia Type: General Level of consciousness: awake Pain management: pain level controlled Vital Signs Assessment: post-procedure vital signs reviewed and stable Cardiovascular status: stable Anesthetic complications: no    Last Vitals:  Vitals:   12/11/17 0856 12/11/17 1245  BP: (!) 170/104   Pulse:    Resp:    Temp:  36.8 C  SpO2:      Last Pain:  Vitals:   12/11/17 1245  TempSrc:   PainSc: 10-Worst pain ever                 Illeana Edick

## 2017-12-11 NOTE — Discharge Instructions (Signed)

## 2017-12-11 NOTE — Transfer of Care (Signed)
Immediate Anesthesia Transfer of Care Note  Patient: Andrew Lamb  Procedure(s) Performed: LEFT TOTAL HIP ARTHROPLASTY (Left Hip)  Patient Location: PACU  Anesthesia Type:General  Level of Consciousness: awake, oriented and patient cooperative  Airway & Oxygen Therapy: Patient Spontanous Breathing and Patient connected to nasal cannula oxygen  Post-op Assessment: Report given to RN and Post -op Vital signs reviewed and stable  Post vital signs: Reviewed  Last Vitals:  Vitals Value Taken Time  BP 166/99 12/11/2017 12:44 PM  Temp    Pulse 91 12/11/2017 12:49 PM  Resp 11 12/11/2017 12:49 PM  SpO2 99 % 12/11/2017 12:49 PM  Vitals shown include unvalidated device data.  Last Pain:  Vitals:   12/11/17 0853  TempSrc:   PainSc: 5          Complications: No apparent anesthesia complications

## 2017-12-11 NOTE — Evaluation (Signed)
Physical Therapy Evaluation Patient Details Name: Andrew Lamb MRN: [id number] DOB: 22-Oct-1975 Today's Date: 12/11/2017   History of Present Illness  Pt is a 42 y/o male s/p L posterior THA. No pertinent PMH in EMR.  Clinical Impression  Pt presented supine in bed with HOB elevated, awake and willing to participate in therapy session. Prior to admission, pt reported that he ambulated with use of SPC and was independent with ADLs. Pt will have assistance from father 24/7 upon d/c. Pt currently requires min A for bed mobility, min guard for transfers and min guard for ambulation within room (~25') with RW. PT provided posterior hip precautions handout and had to frequently remind pt of them throughout session. Pt would continue to benefit from skilled physical therapy services at this time while admitted and after d/c to address the below listed limitations in order to improve overall safety and independence with functional mobility.     Follow Up Recommendations Home health PT;Supervision for mobility/OOB    Equipment Recommendations  Rolling walker with 5" wheels    Recommendations for Other Services       Precautions / Restrictions Precautions Precautions: Fall;Posterior Hip Precaution Booklet Issued: Yes (comment) Precaution Comments: PT reviewed posterior hip precautions with pt throughout Restrictions Weight Bearing Restrictions: Yes LLE Weight Bearing: Weight bearing as tolerated      Mobility  Bed Mobility Overal bed mobility: Needs Assistance Bed Mobility: Supine to Sit;Sit to Supine     Supine to sit: Min assist Sit to supine: Min assist   General bed mobility comments: increased time and effort, cueing to maintain hip precautions, assist with L LE movement off of and onto bed  Transfers Overall transfer level: Needs assistance Equipment used: Rolling walker (2 wheeled) Transfers: Sit to/from Stand Sit to Stand: Min guard         General transfer comment:  cueing for technique, min guard for safety  Ambulation/Gait Ambulation/Gait assistance: Min guard Ambulation Distance (Feet): 25 Feet Assistive device: Rolling walker (2 wheeled) Gait Pattern/deviations: Step-to pattern;Decreased step length - right;Decreased stance time - left;Decreased stride length;Decreased weight shift to left Gait velocity: decreased Gait velocity interpretation: <1.31 ft/sec, indicative of household ambulator General Gait Details: pt required cueing for sequencing with RW, min guard for safety and to maintain posterior hip precautions  Stairs            Wheelchair Mobility    Modified Rankin (Stroke Patients Only)       Balance Overall balance assessment: Needs assistance Sitting-balance support: Feet supported Sitting balance-Leahy Scale: Good     Standing balance support: During functional activity;No upper extremity supported Standing balance-Leahy Scale: Fair Standing balance comment: pt able to stand briefly without any UE supports                             Pertinent Vitals/Pain Pain Assessment: Faces Faces Pain Scale: Hurts even more Pain Location: L hip Pain Descriptors / Indicators: Sore;Guarding;Grimacing Pain Intervention(s): Monitored during session;Repositioned    Home Living Family/patient expects to be discharged to:: Private residence Living Arrangements: Other relatives Available Help at Discharge: Family;Available 24 hours/day Type of Home: House Home Access: Stairs to enter   [employer] of Steps: 4 Home Layout: One level Home Equipment: Cane - single point;Shower seat      Prior Function Level of Independence: Independent with assistive device(s)         Comments: ambulated with [facility]  Hand Dominance        Extremity/Trunk Assessment   Upper Extremity Assessment Upper Extremity Assessment: Overall WFL for tasks assessed    Lower Extremity Assessment Lower Extremity  Assessment: LLE deficits/detail LLE Deficits / Details: pt with weakness secondary to post-op pain. pt able to tolerate partial WB'ing through LE  LLE: Unable to fully assess due to pain    Cervical / Trunk Assessment Cervical / Trunk Assessment: Normal  Communication   Communication: No difficulties  Cognition Arousal/Alertness: Awake/alert Behavior During Therapy: WFL for tasks assessed/performed Overall Cognitive Status: Within Functional Limits for tasks assessed                                        General Comments      Exercises     Assessment/Plan    PT Assessment Patient needs continued PT services  PT Problem List Decreased activity tolerance;Decreased balance;Decreased mobility;Decreased coordination;Decreased knowledge of use of DME;Decreased safety awareness;Decreased knowledge of precautions;Pain       PT Treatment Interventions DME instruction;Gait training;Stair training;Functional mobility training;Therapeutic exercise;Therapeutic activities;Balance training;Neuromuscular re-education;Patient/family education    PT Goals (Current goals can be found in the Care Plan section)  Acute Rehab PT Goals Patient Stated Goal: return home, decrease pain PT Goal Formulation: With patient Time For Goal Achievement: 12/25/17 Potential to Achieve Goals: Good    Frequency 7X/week   Barriers to discharge        Co-evaluation               AM-PAC PT "6 Clicks" Daily Activity  Outcome Measure Difficulty turning over in bed (including adjusting bedclothes, sheets and blankets)?: Unable Difficulty moving from lying on back to sitting on the side of the bed? : Unable Difficulty sitting down on and standing up from a chair with arms (e.g., wheelchair, bedside commode, etc,.)?: Unable Help needed moving to and from a bed to chair (including a wheelchair)?: A Little Help needed walking in hospital room?: A Little Help needed climbing 3-5 steps with a  railing? : A Lot 6 Click Score: 11    End of Session Equipment Utilized During Treatment: Gait belt Activity Tolerance: Patient limited by pain;Patient limited by fatigue Patient left: in bed;with call bell/phone within reach;with family/visitor present Nurse Communication: Mobility status PT Visit Diagnosis: Other abnormalities of gait and mobility (R26.89);Pain Pain - Right/Left: Left Pain - part of body: Hip    Time: 1651-1710 PT Time Calculation (min) (ACUTE ONLY): 19 min   Charges:   PT Evaluation $PT Eval Moderate Complexity: 1 Mod     PT G Codes:        [place]Jennifer Claudell Wohler, PT, DPT [phone]   Alessandra BevelsJennifer M Hang Ammon 12/11/2017, 5:34 PM

## 2017-12-11 NOTE — Interval H&P Note (Signed)
History and Physical Interval Note:  12/11/2017 9:16 AM  Andrew Lamb  has presented today for surgery, with the diagnosis of OA,AVN LEFT HIP  The various methods of treatment have been discussed with the patient and family. After consideration of risks, benefits and other options for treatment, the patient has consented to  Procedure(s): LEFT TOTAL HIP ARTHROPLASTY (Left) as a surgical intervention .  The patient's history has been reviewed, patient examined, no change in status, stable for surgery.  I have reviewed the patient's chart and labs.  Questions were answered to the patient's satisfaction.     Thera FlakeW D Michaelene Dutan Jr

## 2017-12-12 LAB — BASIC METABOLIC PANEL
Anion gap: 9 (ref 5–15)
BUN: 6 mg/dL (ref 6–20)
CALCIUM: 8.7 mg/dL — AB (ref 8.9–10.3)
CHLORIDE: 98 mmol/L — AB (ref 101–111)
CO2: 28 mmol/L (ref 22–32)
CREATININE: 0.64 mg/dL (ref 0.61–1.24)
GFR calc Af Amer: >60 mL/min (ref >60)
GFR calc non Af Amer: >60 mL/min (ref >60)
GLUCOSE: 108 mg/dL — AB (ref 65–99)
Potassium: 3.7 mmol/L (ref 3.5–5.1)
Sodium: 135 mmol/L (ref 135–145)

## 2017-12-12 LAB — CBC
HEMATOCRIT: 37.1 % — AB (ref 39.0–52.0)
Hemoglobin: 12.5 g/dL — ABNORMAL LOW (ref 13.0–17.0)
MCH: 32.4 pg (ref 26.0–34.0)
MCHC: 33.7 g/dL (ref 30.0–36.0)
MCV: 96.1 fL (ref 78.0–100.0)
Platelets: 227 10*3/uL (ref 150–400)
RBC: 3.86 MIL/uL — ABNORMAL LOW (ref 4.22–5.81)
RDW: 13.7 % (ref 11.5–15.5)
WBC: 11.6 10*3/uL — ABNORMAL HIGH (ref 4.0–10.5)

## 2017-12-12 NOTE — Progress Notes (Signed)
Pt wants to go home. Ambulating with  PT this morning. Pain is controlled w/ oral narcotics. Discharge instructions was given to pt. Discharged to home accompanied by his father.

## 2017-12-12 NOTE — Care Management Note (Signed)
Case Management Note  Patient Details  Name: Andrew Lamb MRN: [id number] Date of Birth: 1975-09-15  Subjective/Objective:                 Verified w [facility] that patient will start services after DC. Notified [facility] that patient will need RW and 3/1 delivered to room prior to DC. No other CM needs identified.    Action/Plan:   Expected Discharge Date:  12/12/17               Expected Discharge Plan:  Home w Home Health Services  In-House Referral:     Discharge planning Services  CM Consult  Post Acute Care Choice:  Durable Medical Equipment, Home Health Choice offered to:  Patient  DME Arranged:  3-N-1, Walker rolling DME Agency:  [facility]  HH Arranged:  PT HH Agency:  [facility] (now [facility])  Status of Service:  Completed, signed off  If discussed at [employer] of Stay Meetings, dates discussed:    Additional Comments:  Lawerance Sabal, RN 12/12/2017, 10:07 AM

## 2017-12-12 NOTE — Progress Notes (Signed)
Physical Therapy Treatment Patient Details Name: Andrew Lamb MRN: [id number] DOB: 01/12/1976 Today's Date: 12/12/2017    History of Present Illness Pt is a 42 y/o male s/p L posterior THA. No pertinent PMH in EMR.    PT Comments    Pt making excellent progress with functional mobility and successfully completed stair training this session. Pt is ready for d/c home today from PT perspective.    Follow Up Recommendations  Home health PT;Supervision for mobility/OOB     Equipment Recommendations  Rolling walker with 5" wheels    Recommendations for Other Services       Precautions / Restrictions Precautions Precautions: Fall;Posterior Hip Precaution Booklet Issued: Yes (comment) Precaution Comments: pt able to recall 3/3 precautions Restrictions Weight Bearing Restrictions: Yes LLE Weight Bearing: Weight bearing as tolerated    Mobility  Bed Mobility Overal bed mobility: Needs Assistance Bed Mobility: Supine to Sit;Sit to Supine     Supine to sit: Min assist Sit to supine: Min guard   General bed mobility comments: increased time and effort, cueing to maintain hip precautions, assist with L LE movement off of bed; pt able to use R LE to assist L LE back onto bed  Transfers Overall transfer level: Needs assistance Equipment used: Rolling walker (2 wheeled) Transfers: Sit to/from Stand Sit to Stand: Supervision         General transfer comment: good technique, steady with transition  Ambulation/Gait Ambulation/Gait assistance: Min guard Ambulation Distance (Feet): 200 Feet Assistive device: Rolling walker (2 wheeled) Gait Pattern/deviations: Step-to pattern;Decreased step length - right;Decreased stance time - left;Decreased stride length;Decreased weight shift to left;Step-through pattern Gait velocity: decreased Gait velocity interpretation: <1.31 ft/sec, indicative of household ambulator General Gait Details: pt with good sequencing with RW, steady, no LOB  or need for physical assistance, min guard for safety   Stairs Stairs: Yes Stairs assistance: Supervision Stair Management: Two rails;Step to pattern;Forwards Number of Stairs: 2 General stair comments: pt able to ascend with R LE leading and descend with L LE leading; no issues or concerns, supervision for safety and cueing for technique   Wheelchair Mobility    Modified Rankin (Stroke Patients Only)       Balance Overall balance assessment: Needs assistance Sitting-balance support: Feet supported Sitting balance-Leahy Scale: Good     Standing balance support: During functional activity;No upper extremity supported Standing balance-Leahy Scale: Fair                              Cognition Arousal/Alertness: Awake/alert Behavior During Therapy: WFL for tasks assessed/performed Overall Cognitive Status: Within Functional Limits for tasks assessed                                        Exercises      General Comments        Pertinent Vitals/Pain Pain Assessment: 0-10 Pain Score: 5  Pain Location: L hip Pain Descriptors / Indicators: Sore;Guarding;Grimacing Pain Intervention(s): Monitored during session;Repositioned    Home Living                      Prior Function            PT Goals (current goals can now be found in the care plan section) Acute Rehab PT Goals PT Goal Formulation: With patient Time For Goal Achievement:  12/25/17 Potential to Achieve Goals: Good Progress towards PT goals: Progressing toward goals    Frequency    7X/week      PT Plan Current plan remains appropriate    Co-evaluation              AM-PAC PT "6 Clicks" Daily Activity  Outcome Measure  Difficulty turning over in bed (including adjusting bedclothes, sheets and blankets)?: A Little Difficulty moving from lying on back to sitting on the side of the bed? : Unable Difficulty sitting down on and standing up from a chair with  arms (e.g., wheelchair, bedside commode, etc,.)?: Unable Help needed moving to and from a bed to chair (including a wheelchair)?: None Help needed walking in hospital room?: None Help needed climbing 3-5 steps with a railing? : None 6 Click Score: 17    End of Session Equipment Utilized During Treatment: Gait belt Activity Tolerance: Patient tolerated treatment well Patient left: in bed;with call bell/phone within reach;with SCD's reapplied Nurse Communication: Mobility status PT Visit Diagnosis: Other abnormalities of gait and mobility (R26.89);Pain Pain - Right/Left: Left Pain - part of body: Hip     Time: 1610-9604 PT Time Calculation (min) (ACUTE ONLY): 12 min  Charges:  $Gait Training: 8-22 mins                    G Codes:       [place], , [place] [phone]    Alessandra Bevels Kelbie Moro 12/12/2017, 12:18 PM

## 2017-12-14 ENCOUNTER — Encounter (HOSPITAL_COMMUNITY): Payer: Self-pay | Admitting: Orthopedic Surgery

## 2017-12-14 DIAGNOSIS — R262 Difficulty in walking, not elsewhere classified: Secondary | ICD-10-CM | POA: Diagnosis not present

## 2017-12-14 NOTE — Op Note (Signed)
NAME:  Andrew Lamb, Andrew Lamb                     ACCOUNT NO.:  MEDICAL RECORD NO.:  [id number]  LOCATION:                                 FACILITY:  PHYSICIAN:  Dyke Brackett, M.D.         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  OPERATION:  Left total hip replacement (AML 12.0 mm small stature stem, +5 mm neck length with 36 mm ceramic hip ball, 52 mm Gription acetabular shell sector cup with AltrX polyethylene liner, +4 mm 10 degree).  SURGEON:  Dyke Brackett, M.D.  ASSISTANT:  Margart Sickles, PA-C.  BLOOD LOSS:  350.  DESCRIPTION OF PROCEDURE:  Lateral position with the posterior approach to the hip made.  We split the iliotibial band, gluteus maximus fascia, and the short external rotators of T-capsulotomy in the hip.  There was extreme amount of synovial hypertrophy and inflammation.  It was unclear to me whether this represented a reaction to his probable AVN, but it was hypertrophied and inflamed, so we did send a synovial biopsy.  We dislocated the hip, cut the head off about 1 fingerbreadth above the lesser troch, then progressively broached up to a relatively small statured stem for a male with the broached size being 12.0 mm small stature.  Broaching was done after progressive reaming.  Acetabulum was exposed with retractors and again a significant amount of soft tissue hypertrophy requires an extensive soft tissue dissection of the synovial inlet and paralabral tissues.  Placed 2 wing retractors superiorly.  We then progressively reamed the acetabulum for 1 mm under reaming to accept the 52 mm cup with a 51 mm reaming.  This was done at about 45 degrees of abduction, 15-20 degrees of anteversion.  Final cup was placed with  a trial liner and we trialed off the broach, deemed the parameters to be acceptable, but we would only dislocate with extreme flexion, extreme adduction, and external rotation past 60 degrees. Final components were inserted.  The metal shell had already been inserted.  We inserted the Pinnacle cup with the buildup at about the 3 o'clock position for the left hip.  We then placed the final prosthesis and then trialed off that and settled on the +5 mm neck length. We were able to close some of the pericapsular tissues with #1 Tycron. The iliotibial band and gluteus maximus were closed using a running Tycron and subcu tissues with 2-0 Vicryl, and the skin with stapling device.  Lightly compressive sterile dressing applied and placed in a knee immobilizer.     Dyke Brackett, M.D.     WDC/MEDQ  D:  12/11/2017  T:  12/12/2017  Job:  [id number]

## 2017-12-16 DIAGNOSIS — R262 Difficulty in walking, not elsewhere classified: Secondary | ICD-10-CM | POA: Diagnosis not present
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
## 2017-12-23 DIAGNOSIS — R262 Difficulty in walking, not elsewhere classified: Secondary | ICD-10-CM | POA: Diagnosis not present

## 2017-12-24 DIAGNOSIS — M1612 Unilateral primary osteoarthritis, left hip: Secondary | ICD-10-CM | POA: Diagnosis not present

## 2017-12-25 DIAGNOSIS — R262 Difficulty in walking, not elsewhere classified: Secondary | ICD-10-CM | POA: Diagnosis not present

## 2018-01-21 DIAGNOSIS — M1612 Unilateral primary osteoarthritis, left hip: Secondary | ICD-10-CM | POA: Diagnosis not present

## 2018-02-25 DIAGNOSIS — M1612 Unilateral primary osteoarthritis, left hip: Secondary | ICD-10-CM | POA: Diagnosis not present

## 2018-03-01 DIAGNOSIS — M25551 Pain in right hip: Secondary | ICD-10-CM | POA: Diagnosis not present

## 2018-03-11 DIAGNOSIS — M25551 Pain in right hip: Secondary | ICD-10-CM | POA: Diagnosis not present

## 2018-06-29 DIAGNOSIS — M25551 Pain in right hip: Secondary | ICD-10-CM | POA: Diagnosis not present

## 2018-07-01 ENCOUNTER — Ambulatory Visit: Payer: Self-pay | Admitting: Physician Assistant

## 2018-07-01 DIAGNOSIS — Z720 Tobacco use: Secondary | ICD-10-CM | POA: Diagnosis not present

## 2018-07-01 DIAGNOSIS — Z13220 Encounter for screening for lipoid disorders: Secondary | ICD-10-CM | POA: Diagnosis not present

## 2018-07-01 DIAGNOSIS — I10 Essential (primary) hypertension: Secondary | ICD-10-CM | POA: Diagnosis not present
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
## 2018-07-12 ENCOUNTER — Encounter (HOSPITAL_COMMUNITY): Payer: BLUE CROSS/BLUE SHIELD

## 2018-07-14 DIAGNOSIS — I10 Essential (primary) hypertension: Secondary | ICD-10-CM | POA: Diagnosis not present

## 2018-07-26 ENCOUNTER — Encounter (HOSPITAL_COMMUNITY): Payer: Self-pay

## 2018-07-26 NOTE — Pre-Procedure Instructions (Signed)
CXR 12/11/2017 in epic

## 2018-07-26 NOTE — Patient Instructions (Addendum)
Your procedure is scheduled on: Friday, Dec. 20, 2019   Surgery Time:  7:30AM-9:45AM   [facility]  [facility]    Report to admitting at 5:30 AM   Call this number if you have problems the morning of surgery [phone]   Do not eat food or drink liquids :After Midnight.   Brush your teeth the morning of surgery.   Do NOT smoke after Midnight   Take these medicines the morning of surgery with A SIP OF WATER: None                               You may not have any metal on your body including jewelry, and body piercings             Do not wear lotions, powders, perfumes/cologne, or deodorant                           Men may shave face and neck.   Do not bring valuables to the hospital.  IS NOT             RESPONSIBLE   FOR VALUABLES.   Contacts, dentures or bridgework may not be worn into surgery.   Leave suitcase in the car. After surgery it may be brought to your room.   Special Instructions: Bring a copy of your healthcare power of attorney and living will documents         the day of surgery if you haven't scanned them in before.              Please read over the following fact sheets you were given:  [facility] - Preparing for Surgery Before surgery, you can play an important role.  Because skin is not sterile, your skin needs to be as free of germs as possible.  You can reduce the number of germs on your skin by washing with CHG (chlorahexidine gluconate) soap before surgery.  CHG is an antiseptic cleaner which kills germs and bonds with the skin to continue killing germs even after washing. Please DO NOT use if you have an allergy to CHG or antibacterial soaps.  If your skin becomes reddened/irritated stop using the CHG and inform your nurse when you arrive at Short Stay. Do not shave (including legs and underarms) for at least 48 hours prior to the first CHG shower.  You may shave your face/neck.  Please follow these instructions  carefully:  1.  Shower with CHG Soap the night before surgery and the  morning of surgery.  2.  If you choose to wash your hair, wash your hair first as usual with your normal  shampoo.  3.  After you shampoo, rinse your hair and body thoroughly to remove the shampoo.                             4.  Use CHG as you would any other liquid soap.  You can apply chg directly to the skin and wash.  Gently with a scrungie or clean washcloth.  5.  Apply the CHG Soap to your body ONLY FROM THE NECK DOWN.   Do   not use on face/ open  Wound or open sores. Avoid contact with eyes, ears mouth and   genitals (private parts).                       Wash face,  Genitals (private parts) with your normal soap.             6.  Wash thoroughly, paying special attention to the area where your    surgery  will be performed.  7.  Thoroughly rinse your body with warm water from the neck down.  8.  DO NOT shower/wash with your normal soap after using and rinsing off the CHG Soap.                9.  Pat yourself dry with a clean towel.            10.  Wear clean pajamas.            11.  Place clean sheets on your bed the night of your first shower and do not  sleep with pets. Day of Surgery : Do not apply any lotions/deodorants the morning of surgery.  Please wear clean clothes to the hospital/surgery center.  FAILURE TO FOLLOW THESE INSTRUCTIONS MAY RESULT IN THE CANCELLATION OF YOUR SURGERY  PATIENT SIGNATURE_________________________________  NURSE SIGNATURE__________________________________  ________________________________________________________________________   Andrew Lamb  An incentive spirometer is a tool that can help keep your lungs clear and active. This tool measures how well you are filling your lungs with each breath. Taking long deep breaths may help reverse or decrease the chance of developing breathing (pulmonary) problems (especially infection) following:  A  long period of time when you are unable to move or be active. BEFORE THE PROCEDURE   If the spirometer includes an indicator to show your best effort, your nurse or respiratory therapist will set it to a desired goal.  If possible, sit up straight or lean slightly forward. Try not to slouch.  Hold the incentive spirometer in an upright position. INSTRUCTIONS FOR USE  1. Sit on the edge of your bed if possible, or sit up as far as you can in bed or on a chair. 2. Hold the incentive spirometer in an upright position. 3. Breathe out normally. 4. Place the mouthpiece in your mouth and seal your lips tightly around it. 5. Breathe in slowly and as deeply as possible, raising the piston or the ball toward the top of the column. 6. Hold your breath for 3-5 seconds or for as long as possible. Allow the piston or ball to fall to the bottom of the column. 7. Remove the mouthpiece from your mouth and breathe out normally. 8. Rest for a few seconds and repeat Steps 1 through 7 at least 10 times every 1-2 hours when you are awake. Take your time and take a few normal breaths between deep breaths. 9. The spirometer may include an indicator to show your best effort. Use the indicator as a goal to work toward during each repetition. 10. After each set of 10 deep breaths, practice coughing to be sure your lungs are clear. If you have an incision (the cut made at the time of surgery), support your incision when coughing by placing a pillow or rolled up towels firmly against it. Once you are able to get out of bed, walk around indoors and cough well. You may stop using the incentive spirometer when instructed by your caregiver.  RISKS AND COMPLICATIONS  Take your time  so you do not get dizzy or light-headed.  If you are in pain, you may need to take or ask for pain medication before doing incentive spirometry. It is harder to take a deep breath if you are having pain. AFTER USE  Rest and breathe slowly and  easily.  It can be helpful to keep track of a log of your progress. Your caregiver can provide you with a simple table to help with this. If you are using the spirometer at home, follow these instructions: [place] IF:   You are having difficultly using the spirometer.  You have trouble using the spirometer as often as instructed.  Your pain medication is not giving enough relief while using the spirometer.  You develop fever of 100.5 F (38.1 C) or higher. SEEK IMMEDIATE MEDICAL CARE IF:   You cough up bloody sputum that had not been present before.  You develop fever of 102 F (38.9 C) or greater.  You develop worsening pain at or near the incision site. MAKE SURE YOU:   Understand these instructions.  Will watch your condition.  Will get help right away if you are not doing well or get worse. Document Released: 12/15/2006 Document Revised: 10/27/2011 Document Reviewed: 02/15/2007 ExitCare Patient Information 2014 ExitCare, [place].   ________________________________________________________________________  WHAT IS A BLOOD TRANSFUSION? Blood Transfusion Information  A transfusion is the replacement of blood or some of its parts. Blood is made up of multiple cells which provide different functions.  Red blood cells carry oxygen and are used for blood loss replacement.  White blood cells fight against infection.  Platelets control bleeding.  Plasma helps clot blood.  Other blood products are available for specialized needs, such as hemophilia or other clotting disorders. BEFORE THE TRANSFUSION  Who gives blood for transfusions?   Healthy volunteers who are fully evaluated to make sure their blood is safe. This is blood bank blood. Transfusion therapy is the safest it has ever been in the practice of medicine. Before blood is taken from a donor, a complete history is taken to make sure that person has no history of diseases nor engages in risky social  behavior (examples are intravenous drug use or sexual activity with multiple partners). The donor's travel history is screened to minimize risk of transmitting infections, such as malaria. The donated blood is tested for signs of infectious diseases, such as HIV and hepatitis. The blood is then tested to be sure it is compatible with you in order to minimize the chance of a transfusion reaction. If you or a relative donates blood, this is often done in anticipation of surgery and is not appropriate for emergency situations. It takes many days to process the donated blood. RISKS AND COMPLICATIONS Although transfusion therapy is very safe and saves many lives, the main dangers of transfusion include:   Getting an infectious disease.  Developing a transfusion reaction. This is an allergic reaction to something in the blood you were given. Every precaution is taken to prevent this. The decision to have a blood transfusion has been considered carefully by your caregiver before blood is given. Blood is not given unless the benefits outweigh the risks. AFTER THE TRANSFUSION  Right after receiving a blood transfusion, you will usually feel much better and more energetic. This is especially true if your red blood cells have gotten low (anemic). The transfusion raises the level of the red blood cells which carry oxygen, and this usually causes an energy increase.  The  nurse administering the transfusion will monitor you carefully for complications. HOME CARE INSTRUCTIONS  No special instructions are needed after a transfusion. You may find your energy is better. Speak with your caregiver about any limitations on activity for underlying diseases you may have. SEEK MEDICAL CARE IF:   Your condition is not improving after your transfusion.  You develop redness or irritation at the intravenous (IV) site. SEEK IMMEDIATE MEDICAL CARE IF:  Any of the following symptoms occur over the next 12 hours:  Shaking  chills.  You have a temperature by mouth above 102 F (38.9 C), not controlled by medicine.  Chest, back, or muscle pain.  People around you feel you are not acting correctly or are confused.  Shortness of breath or difficulty breathing.  Dizziness and fainting.  You get a rash or develop hives.  You have a decrease in urine output.  Your urine turns a dark color or changes to pink, red, or brown. Any of the following symptoms occur over the next 10 days:  You have a temperature by mouth above 102 F (38.9 C), not controlled by medicine.  Shortness of breath.  Weakness after normal activity.  The white part of the eye turns yellow (jaundice).  You have a decrease in the amount of urine or are urinating less often.  Your urine turns a dark color or changes to pink, red, or brown. Document Released: 08/01/2000 Document Revised: 10/27/2011 Document Reviewed: 03/20/2008 [facility] Patient Information 2014 [place], [place].  _______________________________________________________________________

## 2018-07-29 ENCOUNTER — Ambulatory Visit: Payer: Self-pay | Admitting: Physician Assistant

## 2018-07-30 ENCOUNTER — Encounter (HOSPITAL_COMMUNITY): Payer: Self-pay

## 2018-07-30 ENCOUNTER — Encounter (HOSPITAL_COMMUNITY)
Admission: RE | Admit: 2018-07-30 | Discharge: 2018-07-30 | Disposition: A | Discharge status: Home / Self Care | Payer: Managed Care, Other (non HMO) | Source: Ambulatory Visit | Attending: Orthopedic Surgery | Admitting: Orthopedic Surgery

## 2018-07-30 ENCOUNTER — Other Ambulatory Visit: Payer: Self-pay

## 2018-07-30 DIAGNOSIS — M1611 Unilateral primary osteoarthritis, right hip: Secondary | ICD-10-CM | POA: Diagnosis not present

## 2018-07-30 DIAGNOSIS — Z01812 Encounter for preprocedural laboratory examination: Secondary | ICD-10-CM | POA: Diagnosis not present

## 2018-07-30 HISTORY — DX: Heartburn: R12

## 2018-07-30 HISTORY — DX: Unspecified osteoarthritis, unspecified site: M19.90

## 2018-07-30 HISTORY — DX: Personal history of other diseases of the digestive system: Z87.19

## 2018-07-30 LAB — COMPREHENSIVE METABOLIC PANEL
ALBUMIN: 4.5 g/dL (ref 3.5–5.0)
ALT: 17 U/L (ref 0–44)
AST: 23 U/L (ref 15–41)
Alkaline Phosphatase: 56 U/L (ref 38–126)
Anion gap: 10 (ref 5–15)
BUN: 16 mg/dL (ref 6–20)
CO2: 27 mmol/L (ref 22–32)
CREATININE: 0.72 mg/dL (ref 0.61–1.24)
Calcium: 9 mg/dL (ref 8.9–10.3)
Chloride: 95 mmol/L — ABNORMAL LOW (ref 98–111)
GFR calc Af Amer: >60 mL/min (ref >60)
GFR calc non Af Amer: >60 mL/min (ref >60)
Glucose, Bld: 101 mg/dL — ABNORMAL HIGH (ref 70–99)
Potassium: 3.8 mmol/L (ref 3.5–5.1)
Sodium: 132 mmol/L — ABNORMAL LOW (ref 135–145)
Total Bilirubin: 0.4 mg/dL (ref 0.3–1.2)
Total Protein: 7.5 g/dL (ref 6.5–8.1)

## 2018-07-30 LAB — CBC WITH DIFFERENTIAL/PLATELET
Abs Immature Granulocytes: 0.03 10*3/uL (ref 0.00–0.07)
BASOS ABS: 0.1 10*3/uL (ref 0.0–0.1)
Basophils Relative: 1 %
Eosinophils Absolute: 0.1 10*3/uL (ref 0.0–0.5)
Eosinophils Relative: 2 %
HCT: 44.3 % (ref 39.0–52.0)
Hemoglobin: 14.7 g/dL (ref 13.0–17.0)
Immature Granulocytes: 1 %
Lymphocytes Relative: 24 %
Lymphs Abs: 1.4 10*3/uL (ref 0.7–4.0)
MCH: 32.6 pg (ref 26.0–34.0)
MCHC: 33.2 g/dL (ref 30.0–36.0)
MCV: 98.2 fL (ref 80.0–100.0)
Monocytes Absolute: 0.7 10*3/uL (ref 0.1–1.0)
Monocytes Relative: 11 %
NEUTROS PCT: 61 %
NRBC: 0 % (ref 0.0–0.2)
Neutro Abs: 3.6 10*3/uL (ref 1.7–7.7)
Platelets: 228 10*3/uL (ref 150–400)
RBC: 4.51 MIL/uL (ref 4.22–5.81)
RDW: 13.2 % (ref 11.5–15.5)
WBC: 5.9 10*3/uL (ref 4.0–10.5)

## 2018-07-30 LAB — APTT: aPTT: 27 seconds (ref 24–36)

## 2018-07-30 LAB — SURGICAL PCR SCREEN
MRSA, PCR: NEGATIVE
Staphylococcus aureus: NEGATIVE

## 2018-07-30 LAB — PROTIME-INR
INR: 0.89
Prothrombin Time: 12 seconds (ref 11.4–15.2)

## 2018-07-30 LAB — ABO/RH: ABO/RH(D): A POS

## 2018-07-30 NOTE — Pre-Procedure Instructions (Signed)
EKG 11/03/2017 in chart

## 2018-07-30 NOTE — Pre-Procedure Instructions (Signed)
CMP results 07/30/2018 sent to Dr. Madelon Lipsaffrey via epic.

## 2018-07-31 LAB — URINE CULTURE: Culture: NO GROWTH

## 2018-08-05 MED ORDER — TRANEXAMIC ACID 1000 MG/10ML IV SOLN
2000.0000 mg | INTRAVENOUS | Status: DC
  Filled 2018-08-05: qty 20

## 2018-08-05 MED ORDER — BUPIVACAINE LIPOSOME 1.3 % IJ SUSP
20.0000 mL | INTRAMUSCULAR | Status: DC
  Filled 2018-08-05: qty 20

## 2018-08-05 NOTE — Anesthesia Preprocedure Evaluation (Addendum)
Anesthesia Evaluation  Patient identified by MRN, date of birth, ID band Patient awake    Reviewed: Allergy & Precautions, NPO status , Patient's Chart, lab work & pertinent test results  Airway Mallampati: II  TM Distance: >3 FB Neck ROM: Full    Dental no notable dental hx.    Pulmonary Current Smoker,    Pulmonary exam normal breath sounds clear to auscultation       Cardiovascular hypertension, Pt. on medications Normal cardiovascular exam Rhythm:Regular Rate:Normal     Neuro/Psych negative neurological ROS  negative psych ROS   GI/Hepatic negative GI ROS, Neg liver ROS,   Endo/Other  negative endocrine ROS  Renal/GU negative Renal ROS  negative genitourinary   Musculoskeletal negative musculoskeletal ROS (+)   Abdominal   Peds negative pediatric ROS (+)  Hematology negative hematology ROS (+)   Anesthesia Other Findings   Reproductive/Obstetrics negative OB ROS                            Anesthesia Physical Anesthesia Plan  ASA: III  Anesthesia Plan: General   Post-op Pain Management:    Induction: Intravenous  PONV Risk Score and Plan: 2 and Ondansetron, Dexamethasone and Treatment may vary due to age or medical condition  Airway Management Planned: Oral ETT  Additional Equipment:   Intra-op Plan:   Post-operative Plan: Extubation in OR  Informed Consent: I have reviewed the patients History and Physical, chart, labs and discussed the procedure including the risks, benefits and alternatives for the proposed anesthesia with the patient or authorized representative who has indicated his/her understanding and acceptance.   Dental advisory given  Plan Discussed with: CRNA and Surgeon  Anesthesia Plan Comments: (Patient has TMJ, jaw can "get stuck" when yawning occasionally)        Anesthesia Quick Evaluation

## 2018-08-06 ENCOUNTER — Inpatient Hospital Stay (HOSPITAL_COMMUNITY): Payer: Managed Care, Other (non HMO)

## 2018-08-06 ENCOUNTER — Encounter (HOSPITAL_COMMUNITY): Payer: Self-pay

## 2018-08-06 ENCOUNTER — Inpatient Hospital Stay (HOSPITAL_COMMUNITY)
Admission: RE | Admit: 2018-08-06 | Discharge: 2018-08-07 | DRG: 470 | Disposition: A | Discharge status: Home Health | Payer: Managed Care, Other (non HMO) | Attending: Orthopedic Surgery | Admitting: Orthopedic Surgery

## 2018-08-06 ENCOUNTER — Encounter (HOSPITAL_COMMUNITY)
Admission: RE | Disposition: A | Discharge status: Home Health | Payer: Self-pay | Source: Home / Self Care | Attending: Orthopedic Surgery

## 2018-08-06 ENCOUNTER — Inpatient Hospital Stay (HOSPITAL_COMMUNITY): Payer: Managed Care, Other (non HMO) | Admitting: Anesthesiology

## 2018-08-06 ENCOUNTER — Other Ambulatory Visit: Payer: Self-pay

## 2018-08-06 DIAGNOSIS — I10 Essential (primary) hypertension: Secondary | ICD-10-CM | POA: Diagnosis present

## 2018-08-06 DIAGNOSIS — M1612 Unilateral primary osteoarthritis, left hip: Secondary | ICD-10-CM

## 2018-08-06 DIAGNOSIS — Z96642 Presence of left artificial hip joint: Secondary | ICD-10-CM | POA: Diagnosis present

## 2018-08-06 DIAGNOSIS — Z880 Allergy status to penicillin: Secondary | ICD-10-CM

## 2018-08-06 DIAGNOSIS — M1611 Unilateral primary osteoarthritis, right hip: Secondary | ICD-10-CM | POA: Diagnosis present

## 2018-08-06 DIAGNOSIS — F17210 Nicotine dependence, cigarettes, uncomplicated: Secondary | ICD-10-CM | POA: Diagnosis present

## 2018-08-06 DIAGNOSIS — Z79891 Long term (current) use of opiate analgesic: Secondary | ICD-10-CM

## 2018-08-06 DIAGNOSIS — M87851 Other osteonecrosis, right femur: Secondary | ICD-10-CM | POA: Diagnosis present

## 2018-08-06 DIAGNOSIS — Z79899 Other long term (current) drug therapy: Secondary | ICD-10-CM | POA: Diagnosis not present

## 2018-08-06 HISTORY — PX: TOTAL HIP ARTHROPLASTY: SHX124

## 2018-08-06 LAB — URINALYSIS, ROUTINE W REFLEX MICROSCOPIC
Bilirubin Urine: NEGATIVE
Glucose, UA: NEGATIVE mg/dL
Hgb urine dipstick: NEGATIVE
KETONES UR: NEGATIVE mg/dL
Leukocytes, UA: NEGATIVE
Nitrite: NEGATIVE
Protein, ur: NEGATIVE mg/dL
Specific Gravity, Urine: 1.015 (ref 1.005–1.030)
pH: 5 (ref 5.0–8.0)

## 2018-08-06 LAB — TYPE AND SCREEN
ABO/RH(D): A POS
Antibody Screen: NEGATIVE

## 2018-08-06 SURGERY — ARTHROPLASTY, HIP, TOTAL,POSTERIOR APPROACH
Anesthesia: General | Site: Hip | Laterality: Right

## 2018-08-06 MED ORDER — MENTHOL 3 MG MT LOZG: 1.0000 | LOZENGE | OROMUCOSAL | Status: DC | PRN

## 2018-08-06 MED ORDER — SODIUM CHLORIDE (PF) 0.9 % IJ SOLN
INTRAMUSCULAR | Status: DC | PRN
  Administered 2018-08-06: 20 mL

## 2018-08-06 MED ORDER — KETAMINE HCL 10 MG/ML IJ SOLN
INTRAMUSCULAR | Status: DC | PRN
  Administered 2018-08-06 (×4): 10 mg via INTRAVENOUS

## 2018-08-06 MED ORDER — VANCOMYCIN HCL IN DEXTROSE 1-5 GM/200ML-% IV SOLN
1000.0000 mg | INTRAVENOUS | Status: AC
  Administered 2018-08-06: 1000 mg via INTRAVENOUS
  Filled 2018-08-06: qty 200

## 2018-08-06 MED ORDER — DEXMEDETOMIDINE HCL IN NACL 200 MCG/50ML IV SOLN
INTRAVENOUS | Status: AC
  Filled 2018-08-06: qty 50

## 2018-08-06 MED ORDER — STERILE WATER FOR IRRIGATION IR SOLN
Status: DC | PRN
  Administered 2018-08-06: 2000 mL

## 2018-08-06 MED ORDER — SUGAMMADEX SODIUM 200 MG/2ML IV SOLN
INTRAVENOUS | Status: AC
  Filled 2018-08-06: qty 2

## 2018-08-06 MED ORDER — CHLORTHALIDONE 25 MG PO TABS
25.0000 mg | ORAL_TABLET | Freq: Every day | ORAL | Status: DC
  Administered 2018-08-07: 25 mg via ORAL
  Filled 2018-08-06: qty 1

## 2018-08-06 MED ORDER — SUGAMMADEX SODIUM 200 MG/2ML IV SOLN
INTRAVENOUS | Status: DC | PRN
  Administered 2018-08-06: 200 mg via INTRAVENOUS

## 2018-08-06 MED ORDER — LACTATED RINGERS IV SOLN
INTRAVENOUS | Status: DC
  Administered 2018-08-06: 1000 mL via INTRAVENOUS

## 2018-08-06 MED ORDER — METOCLOPRAMIDE HCL 5 MG/ML IJ SOLN: 5.0000 mg | Freq: Three times a day (TID) | INTRAMUSCULAR | Status: DC | PRN

## 2018-08-06 MED ORDER — FENTANYL CITRATE (PF) 100 MCG/2ML IJ SOLN
INTRAMUSCULAR | Status: DC | PRN
  Administered 2018-08-06: 100 ug via INTRAVENOUS
  Administered 2018-08-06 (×3): 50 ug via INTRAVENOUS

## 2018-08-06 MED ORDER — BUPIVACAINE-EPINEPHRINE (PF) 0.25% -1:200000 IJ SOLN
INTRAMUSCULAR | Status: DC | PRN
  Administered 2018-08-06: 20 mL

## 2018-08-06 MED ORDER — SODIUM CHLORIDE 0.9 % IV SOLN
INTRAVENOUS | Status: DC
  Administered 2018-08-06: 75 mL/h via INTRAVENOUS
  Administered 2018-08-07: 01:00:00 via INTRAVENOUS

## 2018-08-06 MED ORDER — PHENYLEPHRINE HCL 10 MG/ML IJ SOLN
INTRAMUSCULAR | Status: DC | PRN
  Administered 2018-08-06: 120 ug via INTRAVENOUS
  Administered 2018-08-06 (×2): 80 ug via INTRAVENOUS

## 2018-08-06 MED ORDER — ACETAMINOPHEN 10 MG/ML IV SOLN
1000.0000 mg | Freq: Once | INTRAVENOUS | Status: AC
  Administered 2018-08-06: 1000 mg via INTRAVENOUS

## 2018-08-06 MED ORDER — TRANEXAMIC ACID 1000 MG/10ML IV SOLN: 2000.0000 mg | Freq: Once | INTRAVENOUS | Status: DC

## 2018-08-06 MED ORDER — TRANEXAMIC ACID 1000 MG/10ML IV SOLN
INTRAVENOUS | Status: DC | PRN
  Administered 2018-08-06: 2000 mg via TOPICAL

## 2018-08-06 MED ORDER — SPIRITUS FRUMENTI
2.0000 | ORAL | Status: DC | PRN
  Administered 2018-08-06: 2 via ORAL
  Filled 2018-08-06 (×3): qty 2

## 2018-08-06 MED ORDER — DEXMEDETOMIDINE HCL IN NACL 200 MCG/50ML IV SOLN
INTRAVENOUS | Status: DC | PRN
  Administered 2018-08-06 (×5): 4 ug via INTRAVENOUS

## 2018-08-06 MED ORDER — KETAMINE HCL 10 MG/ML IJ SOLN
INTRAMUSCULAR | Status: AC
  Filled 2018-08-06: qty 1

## 2018-08-06 MED ORDER — DOCUSATE SODIUM 100 MG PO CAPS
100.0000 mg | ORAL_CAPSULE | Freq: Two times a day (BID) | ORAL | Status: DC
  Administered 2018-08-06 – 2018-08-07 (×2): 100 mg via ORAL
  Filled 2018-08-06 (×2): qty 1

## 2018-08-06 MED ORDER — ONDANSETRON HCL 4 MG PO TABS: 4.0000 mg | ORAL_TABLET | Freq: Four times a day (QID) | ORAL | Status: DC | PRN

## 2018-08-06 MED ORDER — BUPIVACAINE-EPINEPHRINE (PF) 0.25% -1:200000 IJ SOLN
INTRAMUSCULAR | Status: AC
  Filled 2018-08-06: qty 30

## 2018-08-06 MED ORDER — HYDROMORPHONE HCL 1 MG/ML IJ SOLN
0.2500 mg | INTRAMUSCULAR | Status: DC | PRN
  Administered 2018-08-06 (×4): 0.5 mg via INTRAVENOUS

## 2018-08-06 MED ORDER — FENTANYL CITRATE (PF) 250 MCG/5ML IJ SOLN
INTRAMUSCULAR | Status: AC
  Filled 2018-08-06: qty 5

## 2018-08-06 MED ORDER — MIDAZOLAM HCL 5 MG/5ML IJ SOLN
INTRAMUSCULAR | Status: DC | PRN
  Administered 2018-08-06: 2 mg via INTRAVENOUS

## 2018-08-06 MED ORDER — OXYCODONE-ACETAMINOPHEN 5-325 MG PO TABS: ORAL_TABLET | ORAL | 0 refills | Status: AC

## 2018-08-06 MED ORDER — DEXAMETHASONE SODIUM PHOSPHATE 10 MG/ML IJ SOLN
INTRAMUSCULAR | Status: AC
  Filled 2018-08-06: qty 1

## 2018-08-06 MED ORDER — PROPOFOL 10 MG/ML IV BOLUS
INTRAVENOUS | Status: AC
  Filled 2018-08-06: qty 20

## 2018-08-06 MED ORDER — LIDOCAINE HCL (CARDIAC) PF 100 MG/5ML IV SOSY
PREFILLED_SYRINGE | INTRAVENOUS | Status: DC | PRN
  Administered 2018-08-06: 100 mg via INTRAVENOUS

## 2018-08-06 MED ORDER — ASPIRIN EC 81 MG PO TBEC: 81.0000 mg | DELAYED_RELEASE_TABLET | Freq: Two times a day (BID) | ORAL | 0 refills | Status: AC

## 2018-08-06 MED ORDER — HYDROMORPHONE HCL 2 MG/ML IJ SOLN
INTRAMUSCULAR | Status: AC
  Filled 2018-08-06: qty 1

## 2018-08-06 MED ORDER — METHOCARBAMOL 500 MG PO TABS
500.0000 mg | ORAL_TABLET | Freq: Four times a day (QID) | ORAL | Status: DC | PRN
  Administered 2018-08-06 – 2018-08-07 (×3): 500 mg via ORAL
  Filled 2018-08-06 (×3): qty 1

## 2018-08-06 MED ORDER — METOCLOPRAMIDE HCL 5 MG PO TABS: 5.0000 mg | ORAL_TABLET | Freq: Three times a day (TID) | ORAL | Status: DC | PRN

## 2018-08-06 MED ORDER — ACETAMINOPHEN 500 MG PO TABS
1000.0000 mg | ORAL_TABLET | Freq: Four times a day (QID) | ORAL | Status: DC
  Administered 2018-08-06 – 2018-08-07 (×3): 1000 mg via ORAL
  Filled 2018-08-06 (×3): qty 2

## 2018-08-06 MED ORDER — FLEET ENEMA 7-19 GM/118ML RE ENEM: 1.0000 | ENEMA | Freq: Once | RECTAL | Status: DC | PRN

## 2018-08-06 MED ORDER — ROCURONIUM BROMIDE 10 MG/ML (PF) SYRINGE
PREFILLED_SYRINGE | INTRAVENOUS | Status: AC
  Filled 2018-08-06: qty 10

## 2018-08-06 MED ORDER — DEXAMETHASONE SODIUM PHOSPHATE 10 MG/ML IJ SOLN
INTRAMUSCULAR | Status: DC | PRN
  Administered 2018-08-06: 10 mg via INTRAVENOUS

## 2018-08-06 MED ORDER — SORBITOL 70 % SOLN: 30.0000 mL | Freq: Every day | Status: DC | PRN

## 2018-08-06 MED ORDER — MIDAZOLAM HCL 2 MG/2ML IJ SOLN
INTRAMUSCULAR | Status: AC
  Filled 2018-08-06: qty 2

## 2018-08-06 MED ORDER — PROMETHAZINE HCL 25 MG/ML IJ SOLN: 6.2500 mg | INTRAMUSCULAR | Status: DC | PRN

## 2018-08-06 MED ORDER — CHLORHEXIDINE GLUCONATE 4 % EX LIQD: 60.0000 mL | Freq: Once | CUTANEOUS | Status: DC

## 2018-08-06 MED ORDER — HYDROMORPHONE HCL 1 MG/ML IJ SOLN
INTRAMUSCULAR | Status: DC | PRN
  Administered 2018-08-06 (×2): 1 mg via INTRAVENOUS

## 2018-08-06 MED ORDER — LISINOPRIL 20 MG PO TABS
20.0000 mg | ORAL_TABLET | Freq: Every day | ORAL | Status: DC
  Administered 2018-08-07: 20 mg via ORAL
  Filled 2018-08-06: qty 1

## 2018-08-06 MED ORDER — SODIUM CHLORIDE 0.9 % IR SOLN
Status: DC | PRN
  Administered 2018-08-06: 1000 mL

## 2018-08-06 MED ORDER — ONDANSETRON HCL 4 MG/2ML IJ SOLN
INTRAMUSCULAR | Status: AC
  Filled 2018-08-06: qty 2

## 2018-08-06 MED ORDER — ACETAMINOPHEN 10 MG/ML IV SOLN
INTRAVENOUS | Status: AC
  Administered 2018-08-06: 1000 mg via INTRAVENOUS
  Filled 2018-08-06: qty 100

## 2018-08-06 MED ORDER — TRANEXAMIC ACID-NACL 1000-0.7 MG/100ML-% IV SOLN
1000.0000 mg | Freq: Once | INTRAVENOUS | Status: AC
  Administered 2018-08-06: 1000 mg via INTRAVENOUS
  Filled 2018-08-06: qty 100

## 2018-08-06 MED ORDER — SODIUM CHLORIDE (PF) 0.9 % IJ SOLN
INTRAMUSCULAR | Status: AC
  Filled 2018-08-06: qty 50

## 2018-08-06 MED ORDER — HYDROMORPHONE HCL 1 MG/ML IJ SOLN
INTRAMUSCULAR | Status: AC
  Administered 2018-08-06: 0.5 mg via INTRAVENOUS
  Filled 2018-08-06: qty 1

## 2018-08-06 MED ORDER — HYDROMORPHONE HCL 1 MG/ML IJ SOLN
0.5000 mg | INTRAMUSCULAR | Status: DC | PRN
  Administered 2018-08-06 – 2018-08-07 (×3): 1 mg via INTRAVENOUS
  Filled 2018-08-06 (×3): qty 1

## 2018-08-06 MED ORDER — TRANEXAMIC ACID-NACL 1000-0.7 MG/100ML-% IV SOLN
1000.0000 mg | INTRAVENOUS | Status: DC
  Administered 2018-08-06: 1000 mg via INTRAVENOUS
  Filled 2018-08-06: qty 100

## 2018-08-06 MED ORDER — METHOCARBAMOL 500 MG IVPB - SIMPLE MED
INTRAVENOUS | Status: AC
  Administered 2018-08-06: 500 mg via INTRAVENOUS
  Filled 2018-08-06: qty 50

## 2018-08-06 MED ORDER — LIDOCAINE 2% (20 MG/ML) 5 ML SYRINGE
INTRAMUSCULAR | Status: AC
  Filled 2018-08-06: qty 5

## 2018-08-06 MED ORDER — ASPIRIN 81 MG PO CHEW
81.0000 mg | CHEWABLE_TABLET | Freq: Two times a day (BID) | ORAL | Status: DC
  Administered 2018-08-06 – 2018-08-07 (×2): 81 mg via ORAL
  Filled 2018-08-06 (×2): qty 1

## 2018-08-06 MED ORDER — BUPIVACAINE LIPOSOME 1.3 % IJ SUSP
INTRAMUSCULAR | Status: DC | PRN
  Administered 2018-08-06: 20 mL

## 2018-08-06 MED ORDER — ACETAMINOPHEN 325 MG PO TABS: 325.0000 mg | ORAL_TABLET | Freq: Four times a day (QID) | ORAL | Status: DC | PRN

## 2018-08-06 MED ORDER — DIPHENHYDRAMINE HCL 12.5 MG/5ML PO ELIX: 12.5000 mg | ORAL_SOLUTION | ORAL | Status: DC | PRN

## 2018-08-06 MED ORDER — SENNOSIDES-DOCUSATE SODIUM 8.6-50 MG PO TABS: 1.0000 | ORAL_TABLET | Freq: Every evening | ORAL | Status: DC | PRN

## 2018-08-06 MED ORDER — HYDROMORPHONE HCL 1 MG/ML IJ SOLN
0.2500 mg | INTRAMUSCULAR | Status: DC | PRN
  Administered 2018-08-06: 0.5 mg via INTRAVENOUS

## 2018-08-06 MED ORDER — VANCOMYCIN HCL IN DEXTROSE 1-5 GM/200ML-% IV SOLN
1000.0000 mg | Freq: Two times a day (BID) | INTRAVENOUS | Status: AC
  Administered 2018-08-06: 1000 mg via INTRAVENOUS
  Filled 2018-08-06: qty 200

## 2018-08-06 MED ORDER — OXYCODONE HCL 5 MG PO TABS
5.0000 mg | ORAL_TABLET | ORAL | Status: DC | PRN
  Administered 2018-08-06: 10 mg via ORAL
  Administered 2018-08-06 (×2): 5 mg via ORAL
  Administered 2018-08-06 – 2018-08-07 (×4): 10 mg via ORAL
  Filled 2018-08-06: qty 2
  Filled 2018-08-06: qty 1
  Filled 2018-08-06 (×3): qty 2
  Filled 2018-08-06: qty 1
  Filled 2018-08-06: qty 2

## 2018-08-06 MED ORDER — PHENOL 1.4 % MT LIQD: 1.0000 | OROMUCOSAL | Status: DC | PRN

## 2018-08-06 MED ORDER — METHOCARBAMOL 500 MG IVPB - SIMPLE MED
500.0000 mg | Freq: Once | INTRAVENOUS | Status: AC
  Administered 2018-08-06: 500 mg via INTRAVENOUS

## 2018-08-06 MED ORDER — ROCURONIUM BROMIDE 100 MG/10ML IV SOLN
INTRAVENOUS | Status: DC | PRN
  Administered 2018-08-06 (×3): 10 mg via INTRAVENOUS
  Administered 2018-08-06: 20 mg via INTRAVENOUS
  Administered 2018-08-06: 60 mg via INTRAVENOUS

## 2018-08-06 MED ORDER — PROPOFOL 10 MG/ML IV BOLUS
INTRAVENOUS | Status: DC | PRN
  Administered 2018-08-06: 200 mg via INTRAVENOUS

## 2018-08-06 MED ORDER — SODIUM CHLORIDE 0.9 % IV SOLN: INTRAVENOUS | Status: DC

## 2018-08-06 MED ORDER — ONDANSETRON HCL 4 MG/2ML IJ SOLN: 4.0000 mg | Freq: Four times a day (QID) | INTRAMUSCULAR | Status: DC | PRN

## 2018-08-06 MED ORDER — MIDAZOLAM HCL 2 MG/2ML IJ SOLN
2.0000 mg | Freq: Once | INTRAMUSCULAR | Status: AC
  Administered 2018-08-06: 2 mg via INTRAVENOUS

## 2018-08-06 SURGICAL SUPPLY — 49 items
BAG ZIPLOCK 12X15 (MISCELLANEOUS) ×2 IMPLANT
BLADE SAW SGTL 73X25 THK (BLADE) ×2 IMPLANT
BLADE SURG SZ10 CARB STEEL (BLADE) ×6 IMPLANT
COVER SURGICAL LIGHT HANDLE (MISCELLANEOUS) ×2 IMPLANT
COVER WAND RF STERILE (DRAPES) ×2 IMPLANT
DRAPE INCISE IOBAN 66X45 STRL (DRAPES) ×2 IMPLANT
DRAPE ORTHO SPLIT 77X108 STRL (DRAPES) ×1
DRAPE POUCH INSTRU U-SHP 10X18 (DRAPES) ×2 IMPLANT
DRAPE SURG ORHT 6 SPLT 77X108 (DRAPES) ×1 IMPLANT
DRAPE U-SHAPE 47X51 STRL (DRAPES) ×2 IMPLANT
DRAPE WARM FLUID 44X44 (DRAPE) IMPLANT
DRSG ADAPTIC 3X8 NADH LF (GAUZE/BANDAGES/DRESSINGS) ×2 IMPLANT
ELECT BLADE TIP CTD 4 INCH (ELECTRODE) ×2 IMPLANT
ELECT REM PT RETURN 15FT ADLT (MISCELLANEOUS) ×2 IMPLANT
EVACUATOR 1/8 PVC DRAIN (DRAIN) IMPLANT
FACESHIELD WRAPAROUND (MASK) ×6 IMPLANT
GAUZE SPONGE 4X4 12PLY STRL (GAUZE/BANDAGES/DRESSINGS) ×2 IMPLANT
GLOVE BIO SURGEON STRL SZ7.5 (GLOVE) ×2 IMPLANT
GLOVE BIOGEL PI IND STRL 6.5 (GLOVE) ×1 IMPLANT
GLOVE BIOGEL PI IND STRL 7.5 (GLOVE) ×2 IMPLANT
GLOVE BIOGEL PI IND STRL 8 (GLOVE) ×2 IMPLANT
GLOVE BIOGEL PI INDICATOR 6.5 (GLOVE) ×1
GLOVE BIOGEL PI INDICATOR 7.5 (GLOVE) ×2
GLOVE BIOGEL PI INDICATOR 8 (GLOVE) ×2
GLOVE SURG ORTHO 8.0 STRL STRW (GLOVE) ×2 IMPLANT
GLOVE SURG SS PI 6.5 STRL IVOR (GLOVE) ×4 IMPLANT
GLOVE SURG SS PI 7.0 STRL IVOR (GLOVE) ×2 IMPLANT
GOWN STRL REUS W/TWL XL LVL3 (GOWN DISPOSABLE) ×8 IMPLANT
HEAD CERAMIC 36 PLUS 8.5 12 14 (Hips) ×2 IMPLANT
IMMOBILIZER KNEE 20 (SOFTGOODS) ×2
IMMOBILIZER KNEE 20 THIGH 36 (SOFTGOODS) ×1 IMPLANT
KIT BASIN OR (CUSTOM PROCEDURE TRAY) ×2 IMPLANT
LINER NEUTRAL 52X36X52 PLUS 4 (Liner) ×2 IMPLANT
MANIFOLD NEPTUNE II (INSTRUMENTS) ×2 IMPLANT
NEEDLE MA TROC 1/2 (NEEDLE) ×2 IMPLANT
PAD ABD 8X10 STRL (GAUZE/BANDAGES/DRESSINGS) ×4 IMPLANT
PIN SECTOR W/GRIP ACE CUP 52MM (Hips) ×2 IMPLANT
PROTECTOR NERVE ULNAR (MISCELLANEOUS) ×2 IMPLANT
STAPLER VISISTAT 35W (STAPLE) ×2 IMPLANT
STEM FEM CMNTLSS SM AML 13.5 (Hips) ×2 IMPLANT
SUT ETHIBOND NAB CT1 #1 30IN (SUTURE) ×8 IMPLANT
SUT VIC AB 1 CTX 36 (SUTURE) ×3
SUT VIC AB 1 CTX36XBRD ANBCTR (SUTURE) ×3 IMPLANT
SUT VIC AB 2-0 CT1 27 (SUTURE) ×2
SUT VIC AB 2-0 CT1 27XBRD (SUTURE) ×2 IMPLANT
SUT VIC AB 2-0 CTX 36 (SUTURE) ×2 IMPLANT
TAPE CLOTH SURG 6X10 WHT LF (GAUZE/BANDAGES/DRESSINGS) ×2 IMPLANT
TOWEL OR 17X26 10 PK STRL BLUE (TOWEL DISPOSABLE) ×6 IMPLANT
TRAY FOLEY MTR SLVR 16FR STAT (SET/KITS/TRAYS/PACK) ×2 IMPLANT

## 2018-08-06 NOTE — Care Management Note (Addendum)
Case Management Note  Patient Details  Name: Andrew Lamb MRN: [id number] Date of Birth: 05-29-1976  Subjective/Objective:                  DISCHARGE PLANNING  Action/Plan: [facility] and referral called in to care [facility] [phone]/[facility]- 1-800 number called and reference number [id number] given/ still having trouble finding a in network provider/request escalated to supervisor due to time frame. Expected Discharge Date:                  Expected Discharge Plan:     In-House Referral:     Discharge planning Services     Post Acute Care Choice:    Choice offered to:     DME Arranged:    DME Agency:     HH Arranged:    HH Agency:     Status of Service:     If discussed at [employer] Meetings, dates discussed:    Additional Comments:  Golda AcreDavis, Rhonda Lynn, RN 08/06/2018, 4:14 PM

## 2018-08-06 NOTE — Anesthesia Procedure Notes (Signed)
Procedure Name: Intubation Date/Time: 08/06/2018 7:57 AM Performed by: Thornell MuleStubblefield, Cody Albus G, CRNA Pre-anesthesia Checklist: Patient identified, Emergency Drugs available, Suction available and Patient being monitored Patient Re-evaluated:Patient Re-evaluated prior to induction Oxygen Delivery Method: Circle system utilized Preoxygenation: Pre-oxygenation with 100% oxygen Induction Type: IV induction Ventilation: Mask ventilation without difficulty Laryngoscope Size: Miller and 3 Grade View: Grade I Tube type: Oral Tube size: 7.5 mm Number of attempts: 1 Airway Equipment and Method: Stylet and Oral airway Placement Confirmation: ETT inserted through vocal cords under direct vision,  positive ETCO2 and breath sounds checked- equal and bilateral Secured at: 21 cm Tube secured with: Tape Dental Injury: Teeth and Oropharynx as per pre-operative assessment

## 2018-08-06 NOTE — Brief Op Note (Signed)
08/06/2018  9:52 AM  PATIENT:  Andrew Lamb  42 y.o. male  PRE-OPERATIVE DIAGNOSIS:  OA RIGHT HIP  POST-OPERATIVE DIAGNOSIS:  OA RIGHT HIP  PROCEDURE:  Procedure(s): RIGHT TOTAL HIP ARTHROPLASTY (Right)  SURGEON:  Surgeon(s) and Role:    Frederico Hamman* Caffrey, Daniel, MD - Primary  PHYSICIAN ASSISTANT:  Margart SicklesJoshua Maika Mcelveen, PA-C  ASSISTANTS: OR staff x1   ANESTHESIA:   local and general  EBL:  450 mL   BLOOD ADMINISTERED:none  DRAINS: none   LOCAL MEDICATIONS USED:  MARCAINE     SPECIMEN:  No Specimen  DISPOSITION OF SPECIMEN:  N/A  COUNTS:  YES  TOURNIQUET:  * No tourniquets in log *  DICTATION: .Other Dictation: Dictation Number unknown  PLAN OF CARE: Admit to inpatient   PATIENT DISPOSITION:  PACU - hemodynamically stable.   Delay start of Pharmacological VTE agent (>24hrs) due to surgical blood loss or risk of bleeding: yes

## 2018-08-06 NOTE — Discharge Instructions (Signed)

## 2018-08-06 NOTE — Transfer of Care (Signed)
Immediate Anesthesia Transfer of Care Note  Patient: Andrew Lamb  Procedure(s) Performed: RIGHT TOTAL HIP ARTHROPLASTY (Right Hip)  Patient Location: PACU  Anesthesia Type:General  Level of Consciousness: awake, alert  and oriented  Airway & Oxygen Therapy: Patient Spontanous Breathing and Patient connected to face mask oxygen  Post-op Assessment: Report given to RN and Post -op Vital signs reviewed and stable  Post vital signs: Reviewed and stable  Last Vitals:  Vitals Value Taken Time  BP 138/100 08/06/2018 10:02 AM  Temp 36.4 C 08/06/2018 10:00 AM  Pulse 80 08/06/2018 10:04 AM  Resp 10 08/06/2018 10:04 AM  SpO2 100 % 08/06/2018 10:04 AM  Vitals shown include unvalidated device data.  Last Pain:  Vitals:   08/06/18 0618  TempSrc:   PainSc: 0-No pain      Patients Stated Pain Goal: 3 (08/06/18 0618)  Complications: No apparent anesthesia complications

## 2018-08-06 NOTE — Op Note (Signed)
NAME: Andrew Lamb, Andrew D. MEDICAL RECORD VH:[id number] ACCOUNT [id number] DATE OF BIRTH:[birth year] FACILITY: [facility] LOCATION: [facility] PHYSICIAN:W. Ijeoma Loor JR., MD  OPERATIVE REPORT  DATE OF PROCEDURE:  08/06/2018  PREOPERATIVE DIAGNOSIS:  Severe avascular necrosis, right hip.  OPERATION:  Right total hip replacement (AML 13.5 mm small statured stem with +8.5 Biolox ceramic femoral head, 52 mm Gription cup with Pinnacle AltrX +4, 10-degree lip liner.  SURGEON:  Marcie MowersW. Rhett Najera, MD  ASSISTANVincent Peyer:  [place], PA.  ESTIMATED BLOOD LOSS:  400  DESCRIPTION OF PROCEDURE:  Lateral position posterior approach to the hip, made, splitting the gluteus maximus fascia with iliotibial band.  Short external rotators.  T capsulotomy was made in the hip, cut the femoral head after dislocating the head  about one fingerbreadth above the lesser trochanter.  We progressively reamed and then rasped to accept the 13.5 mm small statured stem.  Acetabular retractors were placed anteriorly and inferiorly.  We exposed the acetabulum and preserved the capsule.   We progressively reamed the acetabulum in about 45-50 degrees of abduction 10-15 degrees of anteversion, 1 mm under-reamed to accept a 52 mm cup.  Final cup was placed with a trial liner followed by using the trial broach as a trial followed by placement  of the final prosthesis.  We then trialed off the final prosthesis.  Once the final cup was placed, we settled on the +8.5 mm, which basically demonstrated excellent stability.  The patient could only be dislocated with greater than 110 degrees flexion,  full adduction, and internal rotation past 90 degrees.  Wound was irrigated.  Closure was affected on the capsulotomy with #1 Ethibond.  The fascia lata gluteus maximus with a running Ethibond, subcutaneous tissues with 2-0 Vicryl, skin with skin clips.   A light compressive sterile dressing and knee immobilizer applied, taken to recovery room in stable  condition.  TN/NUANCE  D:08/06/2018 T:08/06/2018 JOB:[id number]

## 2018-08-06 NOTE — Evaluation (Signed)
Physical Therapy Evaluation Patient Details Name: Andrew Lamb MRN: [id number] DOB: 1976/04/23 Today's Date: 08/06/2018   History of Present Illness  42 yo male s/p R THA posterior approach on 08/06/18. PMH includes OA, HTN, dyspnea, L THA posterior approach 11/2017.   Clinical Impression   Pt presents with moderate R hip pain, difficulty performing bed mobility, decreased application of posterior hip precautions with mobility, and decreased activity tolerance due to pain. Pt to benefit from acute PT to address deficits. Pt ambulated 120 ft with RW with min guard assist, frequent verbal cues for hip precautions with ambulation. Pt educated on ankle pumps (20/hour) to perform this afternoon/evening to increase circulation. PT to progress mobility as tolerated, and will continue to follow acutely.      Follow Up Recommendations Follow surgeon's recommendation for DC plan and follow-up therapies;Supervision for mobility/OOB(HHPT )    Equipment Recommendations  None recommended by PT    Recommendations for Other Services       Precautions / Restrictions Precautions Precautions: Fall;Posterior Hip Precaution Booklet Issued: No Precaution Comments: Pt recalls posterior hip precautions to PT, remembered from previous hip replacement.  Restrictions Weight Bearing Restrictions: No Other Position/Activity Restrictions: WBAT       Mobility  Bed Mobility Overal bed mobility: Needs Assistance Bed Mobility: Sit to Supine;Supine to Sit     Supine to sit: Min assist;HOB elevated Sit to supine: Min assist;HOB elevated   General bed mobility comments: Min assist for supine<>sit for RLE management, maintaining hip precautions when scooting to EOB. Verbal cuing provided throughout for sequencing, maintaining posterior hip precautions.   Transfers Overall transfer level: Needs assistance Equipment used: Rolling walker (2 wheeled) Transfers: Sit to/from Stand Sit to Stand: Min guard;From  elevated surface         General transfer comment: Min guard for safety. Verbal cuing for hand placement, maintaining <90* hip flexion sitting EOB and when standing. Pt with self-steadying upon standing.   Ambulation/Gait Ambulation/Gait assistance: Min guard Gait Distance (Feet): 120 Feet Assistive device: Rolling walker (2 wheeled) Gait Pattern/deviations: Step-through pattern;Decreased weight shift to right;Decreased stance time - right;Antalgic;Step-to pattern Gait velocity: normal   General Gait Details: Min guard for safety. Frequent verbal cuing for maintaining hip precautions, pt with tendency for hip IR especially with turning. Verbal cuing also for placement in RW, taking his time for safety, turning, and sequencing. Pt quickly progressed to step-through gait.   Stairs            Wheelchair Mobility    Modified Rankin (Stroke Patients Only)       Balance Overall balance assessment: Mild deficits observed, not formally tested                                           Pertinent Vitals/Pain Pain Assessment: 0-10 Pain Score: 5  Pain Location: R hip  Pain Descriptors / Indicators: Sore;Aching Pain Intervention(s): Limited activity within patient's tolerance;Repositioned;Monitored during session;Premedicated before session;Ice applied    Home Living Family/patient expects to be discharged to:: Private residence Living Arrangements: Parent(Lives with father ) Available Help at Discharge: Family;Available 24 hours/day Type of Home: House Home Access: Stairs to enter Entrance Stairs-Rails: None Entrance Stairs-Number of Steps: 4 Home Layout: One level Home Equipment: Bedside commode;Walker - 2 wheels;Cane - single point;Shower seat      Prior Function Level of Independence: Independent with assistive device(s)  Comments: pt ambulated with SPC prior to admission for a week prior      Hand Dominance   Dominant Hand: Left     Extremity/Trunk Assessment   Upper Extremity Assessment Upper Extremity Assessment: Overall WFL for tasks assessed    Lower Extremity Assessment Lower Extremity Assessment: Overall WFL for tasks assessed;RLE deficits/detail RLE Deficits / Details: suspected post-surgical hip weakness; able to perform hip ER, quad set, ankle pumps  RLE Sensation: WNL    Cervical / Trunk Assessment Cervical / Trunk Assessment: Normal  Communication   Communication: No difficulties  Cognition Arousal/Alertness: Awake/alert Behavior During Therapy: WFL for tasks assessed/performed;Impulsive Overall Cognitive Status: Within Functional Limits for tasks assessed                                 General Comments: Pt moves quickly, sat with close to 90* hip flexion, periods of hip IR that had to be corrected by PT.       General Comments      Exercises     Assessment/Plan    PT Assessment Patient needs continued PT services  PT Problem List Decreased strength;Pain;Decreased activity tolerance;Decreased knowledge of use of DME;Decreased balance;Decreased safety awareness;Decreased mobility       PT Treatment Interventions DME instruction;Therapeutic activities;Gait training;Therapeutic exercise;Patient/family education;Stair training;Balance training;Functional mobility training    PT Goals (Current goals can be found in the Care Plan section)  Acute Rehab PT Goals Patient Stated Goal: go home ASAP PT Goal Formulation: With patient Time For Goal Achievement: 08/13/18 Potential to Achieve Goals: Good    Frequency 7X/week   Barriers to discharge        Co-evaluation               AM-PAC PT "6 Clicks" Mobility  Outcome Measure Help needed turning from your back to your side while in a flat bed without using bedrails?: A Little Help needed moving from lying on your back to sitting on the side of a flat bed without using bedrails?: A Little Help needed moving to and from  a bed to a chair (including a wheelchair)?: A Little Help needed standing up from a chair using your arms (e.g., wheelchair or bedside chair)?: A Little Help needed to walk in hospital room?: A Little Help needed climbing 3-5 steps with a railing? : A Little 6 Click Score: 18    End of Session Equipment Utilized During Treatment: Gait belt Activity Tolerance: Patient tolerated treatment well;No increased pain Patient left: in bed;with bed alarm set;with call bell/phone within reach;with SCD's reapplied Nurse Communication: Mobility status PT Visit Diagnosis: Other abnormalities of gait and mobility (R26.89);Difficulty in walking, not elsewhere classified (R26.2)    Time: [id number] PT Time Calculation (min) (ACUTE ONLY): 27 min   Charges:   PT Evaluation $PT Eval Low Complexity: 1 Low PT Treatments $Gait Training: 8-22 mins        Nicola PoliceAlexa D Oletha Tolson, PT Acute Rehabilitation Services Pager [phone]  Office [phone]   Andrew Lamb 08/06/2018, 5:39 PM
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
## 2018-08-07 LAB — BASIC METABOLIC PANEL
Anion gap: 8 (ref 5–15)
BUN: 11 mg/dL (ref 6–20)
CO2: 29 mmol/L (ref 22–32)
Calcium: 8.9 mg/dL (ref 8.9–10.3)
Chloride: 95 mmol/L — ABNORMAL LOW (ref 98–111)
Creatinine, Ser: 0.78 mg/dL (ref 0.61–1.24)
GFR calc Af Amer: >60 mL/min (ref >60)
GFR calc non Af Amer: >60 mL/min (ref >60)
Glucose, Bld: 112 mg/dL — ABNORMAL HIGH (ref 70–99)
POTASSIUM: 3.7 mmol/L (ref 3.5–5.1)
Sodium: 132 mmol/L — ABNORMAL LOW (ref 135–145)

## 2018-08-07 LAB — CBC
HCT: 37.8 % — ABNORMAL LOW (ref 39.0–52.0)
Hemoglobin: 12.7 g/dL — ABNORMAL LOW (ref 13.0–17.0)
MCH: 32.6 pg (ref 26.0–34.0)
MCHC: 33.6 g/dL (ref 30.0–36.0)
MCV: 96.9 fL (ref 80.0–100.0)
Platelets: 229 10*3/uL (ref 150–400)
RBC: 3.9 MIL/uL — ABNORMAL LOW (ref 4.22–5.81)
RDW: 13.2 % (ref 11.5–15.5)
WBC: 8.4 10*3/uL (ref 4.0–10.5)
nRBC: 0 % (ref 0.0–0.2)

## 2018-08-07 MED ORDER — DOCUSATE SODIUM 100 MG PO CAPS: 100.0000 mg | ORAL_CAPSULE | Freq: Two times a day (BID) | ORAL | 0 refills | Status: AC

## 2018-08-07 MED ORDER — POLYETHYLENE GLYCOL 3350 17 G PO PACK: 17.0000 g | PACK | Freq: Two times a day (BID) | ORAL | 0 refills | Status: AC

## 2018-08-07 NOTE — Progress Notes (Signed)
Physical Therapy Treatment Patient Details Name: Andrew Lamb MRN: [id number] DOB: 1976/01/10 Today's Date: 08/07/2018    History of Present Illness 42 yo male s/p R THA posterior approach on 08/06/18. PMH includes OA, HTN, dyspnea, L THA posterior approach 11/2017.     PT Comments    POD # 1 am session Pt eager to get home.  Demonstrated and instructed pt how to use belt to self assist LE as a leg lifter Assisted OOB to amb a greater distance in hallway.  Practiced stairs then returned to room to bed to rest and ICE.   Follow Up Recommendations  Follow surgeon's recommendation for DC plan and follow-up therapies;Supervision for mobility/OOB(HH)     Equipment Recommendations  None recommended by PT    Recommendations for Other Services       Precautions / Restrictions Precautions Precautions: Fall;Posterior Hip Precaution Comments: Pt recalls posterior hip precautions to PT, remembered from previous hip replacement.  Restrictions Weight Bearing Restrictions: No Other Position/Activity Restrictions: WBAT     Mobility  Bed Mobility Overal bed mobility: Needs Assistance Bed Mobility: Sit to Supine;Supine to Sit     Supine to sit: Supervision;Min guard Sit to supine: Supervision;Min guard   General bed mobility comments: demonstarted and instructed how to use a belt to self assist R LE off bed/on bed   Transfers Overall transfer level: Needs assistance Equipment used: Rolling walker (2 wheeled) Transfers: Sit to/from Stand Sit to Stand: Supervision;Min guard         General transfer comment: pt impulsive, VC's safety and to avoid hip flex > 90 degrees  Ambulation/Gait Ambulation/Gait assistance: Supervision;Min guard Gait Distance (Feet): 175 Feet Assistive device: Rolling walker (2 wheeled) Gait Pattern/deviations: Step-through pattern;Decreased weight shift to right;Decreased stance time - right;Antalgic;Step-to pattern Gait velocity: quick   General Gait  Details: tolerated an increased distance.  quick gait.  VC's safety with turns    Stairs Stairs: Yes Stairs assistance: Supervision;Min guard Stair Management: Two rails;Forwards Number of Stairs: 3 General stair comments: one initial VC safety.     Wheelchair Mobility    Modified Rankin (Stroke Patients Only)       Balance                                            Cognition Arousal/Alertness: Awake/alert Behavior During Therapy: WFL for tasks assessed/performed;Impulsive Overall Cognitive Status: Within Functional Limits for tasks assessed                                        Exercises      General Comments        Pertinent Vitals/Pain Pain Assessment: 0-10 Pain Score: 4  Pain Location: R hip  Pain Descriptors / Indicators: Sore;Aching;Tightness;Tender Pain Intervention(s): Monitored during session;Repositioned;Premedicated before session;Ice applied    Home Living                      Prior Function            PT Goals (current goals can now be found in the care plan section) Progress towards PT goals: Progressing toward goals    Frequency    7X/week      PT Plan Current plan remains appropriate    Co-evaluation  AM-PAC PT "6 Clicks" Mobility   Outcome Measure  Help needed turning from your back to your side while in a flat bed without using bedrails?: A Little Help needed moving from lying on your back to sitting on the side of a flat bed without using bedrails?: A Little Help needed moving to and from a bed to a chair (including a wheelchair)?: A Little Help needed standing up from a chair using your arms (e.g., wheelchair or bedside chair)?: A Little Help needed to walk in hospital room?: A Little Help needed climbing 3-5 steps with a railing? : A Little 6 Click Score: 18    End of Session Equipment Utilized During Treatment: Gait belt Activity Tolerance: Patient tolerated  treatment well;No increased pain Patient left: in bed;with bed alarm set;with call bell/phone within reach;with SCD's reapplied Nurse Communication: Mobility status PT Visit Diagnosis: Other abnormalities of gait and mobility (R26.89);Difficulty in walking, not elsewhere classified (R26.2)     Time: [id number] PT Time Calculation (min) (ACUTE ONLY): 25 min  Charges:  $Gait Training: 8-22 mins $Therapeutic Activity: 8-22 mins                     Felecia ShellingLori Wilburn Keir  PTA Acute  Rehabilitation Services Pager      [phone] Office      [phone]

## 2018-08-07 NOTE — Plan of Care (Signed)
Plan of care reviewed and discussed with the patient. 
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
## 2018-08-07 NOTE — Progress Notes (Signed)
Pt insisting that he was told he would be discharged today.  No discharge orders received.  Pt made aware.  Pt wanting IV removed at this time and states that he is leaving.  IV removed by NA.  PA tried to be contacted concerning pt desire to be discharged.  Prescriptions discussed with pt.

## 2018-08-07 NOTE — Progress Notes (Signed)
Physical Therapy Treatment Patient Details Name: Andrew NumberMartin D Lamb MRN: [id number] DOB: [birth year] Today's Date: 08/07/2018    History of Present Illness 42 yo male s/p R THA posterior approach on 08/06/18. PMH includes OA, HTN, dyspnea, L THA posterior approach 11/2017.     PT Comments    POD # 1 Performed some TE's following HEP handout.  Instructed on proper tech, freq as well as use of ICE.   Pt ready for D/C to home  Follow Up Recommendations  Follow surgeon's recommendation for DC plan and follow-up therapies;Supervision for mobility/OOB(HH)     Equipment Recommendations  None recommended by PT    Recommendations for Other Services       Precautions / Restrictions Precautions Precautions: Fall;Posterior Hip Precaution Comments: Pt recalls posterior hip precautions to PT, remembered from previous hip replacement.  Restrictions Weight Bearing Restrictions: No Other Position/Activity Restrictions: WBAT           Cognition Arousal/Alertness: Awake/alert Behavior During Therapy: WFL for tasks assessed/performed;Impulsive Overall Cognitive Status: Within Functional Limits for tasks assessed                                        Exercises   Total Hip Replacement TE's 10 reps ankle pumps 10 reps knee presses 10 reps heel slides 10 reps SAQ's 10 reps ABD Followed by ICE     General Comments        Pertinent Vitals/Pain Pain Assessment: 0-10 Pain Score: 4  Pain Location: R hip  Pain Descriptors / Indicators: Sore;Aching;Tightness;Tender Pain Intervention(s): Monitored during session;Repositioned;Premedicated before session;Ice applied    Home Living                      Prior Function            PT Goals (current goals can now be found in the care plan section) Progress towards PT goals: Progressing toward goals    Frequency    7X/week      PT Plan Current plan remains appropriate    Co-evaluation               AM-PAC PT "6 Clicks" Mobility   Outcome Measure  Help needed turning from your back to your side while in a flat bed without using bedrails?: A Little Help needed moving from lying on your back to sitting on the side of a flat bed without using bedrails?: A Little Help needed moving to and from a bed to a chair (including a wheelchair)?: A Little Help needed standing up from a chair using your arms (e.g., wheelchair or bedside chair)?: A Little Help needed to walk in hospital room?: A Little Help needed climbing 3-5 steps with a railing? : A Little 6 Click Score: 18    End of Session Equipment Utilized During Treatment: Gait belt Activity Tolerance: Patient tolerated treatment well;No increased pain Patient left: in bed;with bed alarm set;with call bell/phone within reach;with SCD's reapplied Nurse Communication: Mobility status PT Visit Diagnosis: Other abnormalities of gait and mobility (R26.89);Difficulty in walking, not elsewhere classified (R26.2)     Time: 1010-1025 PT Time Calculation (min) (ACUTE ONLY): 15 min  Charges:   $Therapeutic Exercise: 8-22 mins                      Felecia ShellingLori Abdiel Blackerby  PTA Acute  Rehabilitation Services Pager  [phone] Office      [phone]

## 2018-08-07 NOTE — Care Management Note (Signed)
Case Management Note  Patient Details  Name: Andrew Lamb MRN: [id number] Date of Birth: 04/16/1976  Subjective/Objective:    Right THA                Action/Plan: NCM spoke to pt and made aware that [employer] provided will [facility]. States he has RW and 3n1 bedside commode at home.   Expected Discharge Date:  08/07/18               Expected Discharge Plan:  Home w Home Health Services  In-House Referral:  NA  Discharge planning Services  CM Consult  Post Acute Care Choice:  Home Health Choice offered to:  NA  DME Arranged:  N/A DME Agency:  NA  HH Arranged:  PT HH Agency:  Other - See comment  Status of Service:  Completed, signed off  If discussed at Long Length of Stay Meetings, dates discussed:    Additional Comments:  Elliot CousinShavis, Jesson Foskey Ellen, RN 08/07/2018, 12:11 PM

## 2018-08-09 ENCOUNTER — Encounter (HOSPITAL_COMMUNITY): Payer: Self-pay | Admitting: Orthopedic Surgery

## 2018-08-09 NOTE — Anesthesia Postprocedure Evaluation (Signed)
Anesthesia Post Note  Patient: Andrew Lamb  Procedure(s) Performed: RIGHT TOTAL HIP ARTHROPLASTY (Right Hip)     Patient location during evaluation: PACU Anesthesia Type: General Level of consciousness: awake and alert Pain management: pain level controlled Vital Signs Assessment: post-procedure vital signs reviewed and stable Respiratory status: spontaneous breathing, nonlabored ventilation, respiratory function stable and patient connected to nasal cannula oxygen Cardiovascular status: blood pressure returned to baseline and stable Postop Assessment: no apparent nausea or vomiting Anesthetic complications: no    Last Vitals:  Vitals:   08/07/18 0503 08/07/18 0945  BP: (!) 149/89 (!) 140/92  Pulse: 78 88  Resp: 12 16  Temp: 36.7 C (!) 36.4 C  SpO2: 100% 98%    Last Pain:  Vitals:   08/07/18 0945  TempSrc: Oral  PainSc:                  [name]

## 2019-09-15 DIAGNOSIS — Z20828 Contact with and (suspected) exposure to other viral communicable diseases: Secondary | ICD-10-CM | POA: Diagnosis not present

## 2019-10-13 DIAGNOSIS — L0201 Cutaneous abscess of face: Secondary | ICD-10-CM | POA: Diagnosis not present

## 2019-10-13 DIAGNOSIS — L03211 Cellulitis of face: Secondary | ICD-10-CM | POA: Diagnosis not present

## 2019-10-13 DIAGNOSIS — L03119 Cellulitis of unspecified part of limb: Secondary | ICD-10-CM | POA: Diagnosis not present

## 2020-03-27 IMAGING — CR DG CHEST 2V
2 series · 2 of 2 positions shown · non-contrast
Comparison: No recent prior.

CLINICAL DATA: Left hip arthroplasty.

EXAM:
CHEST - 2 VIEW

[w chest pa]
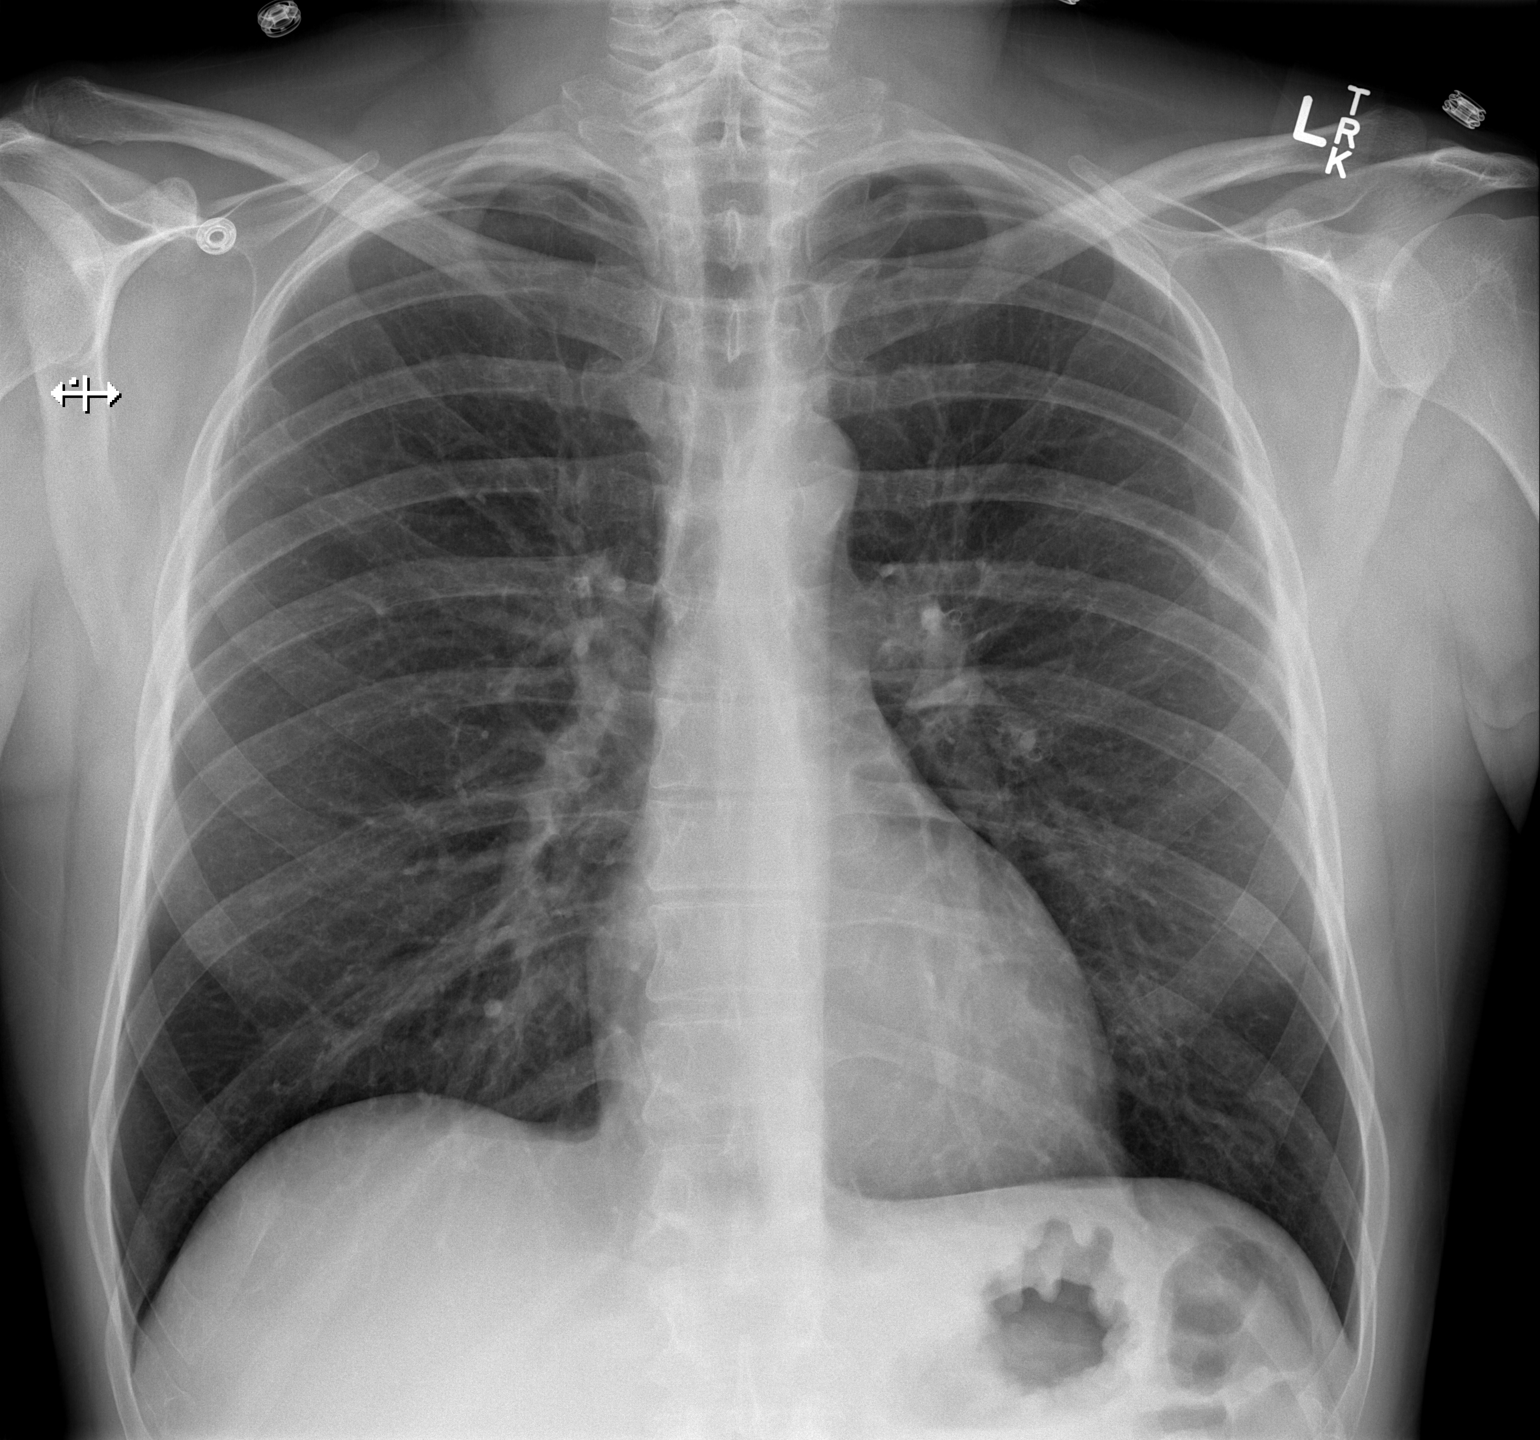

[w chest lat]
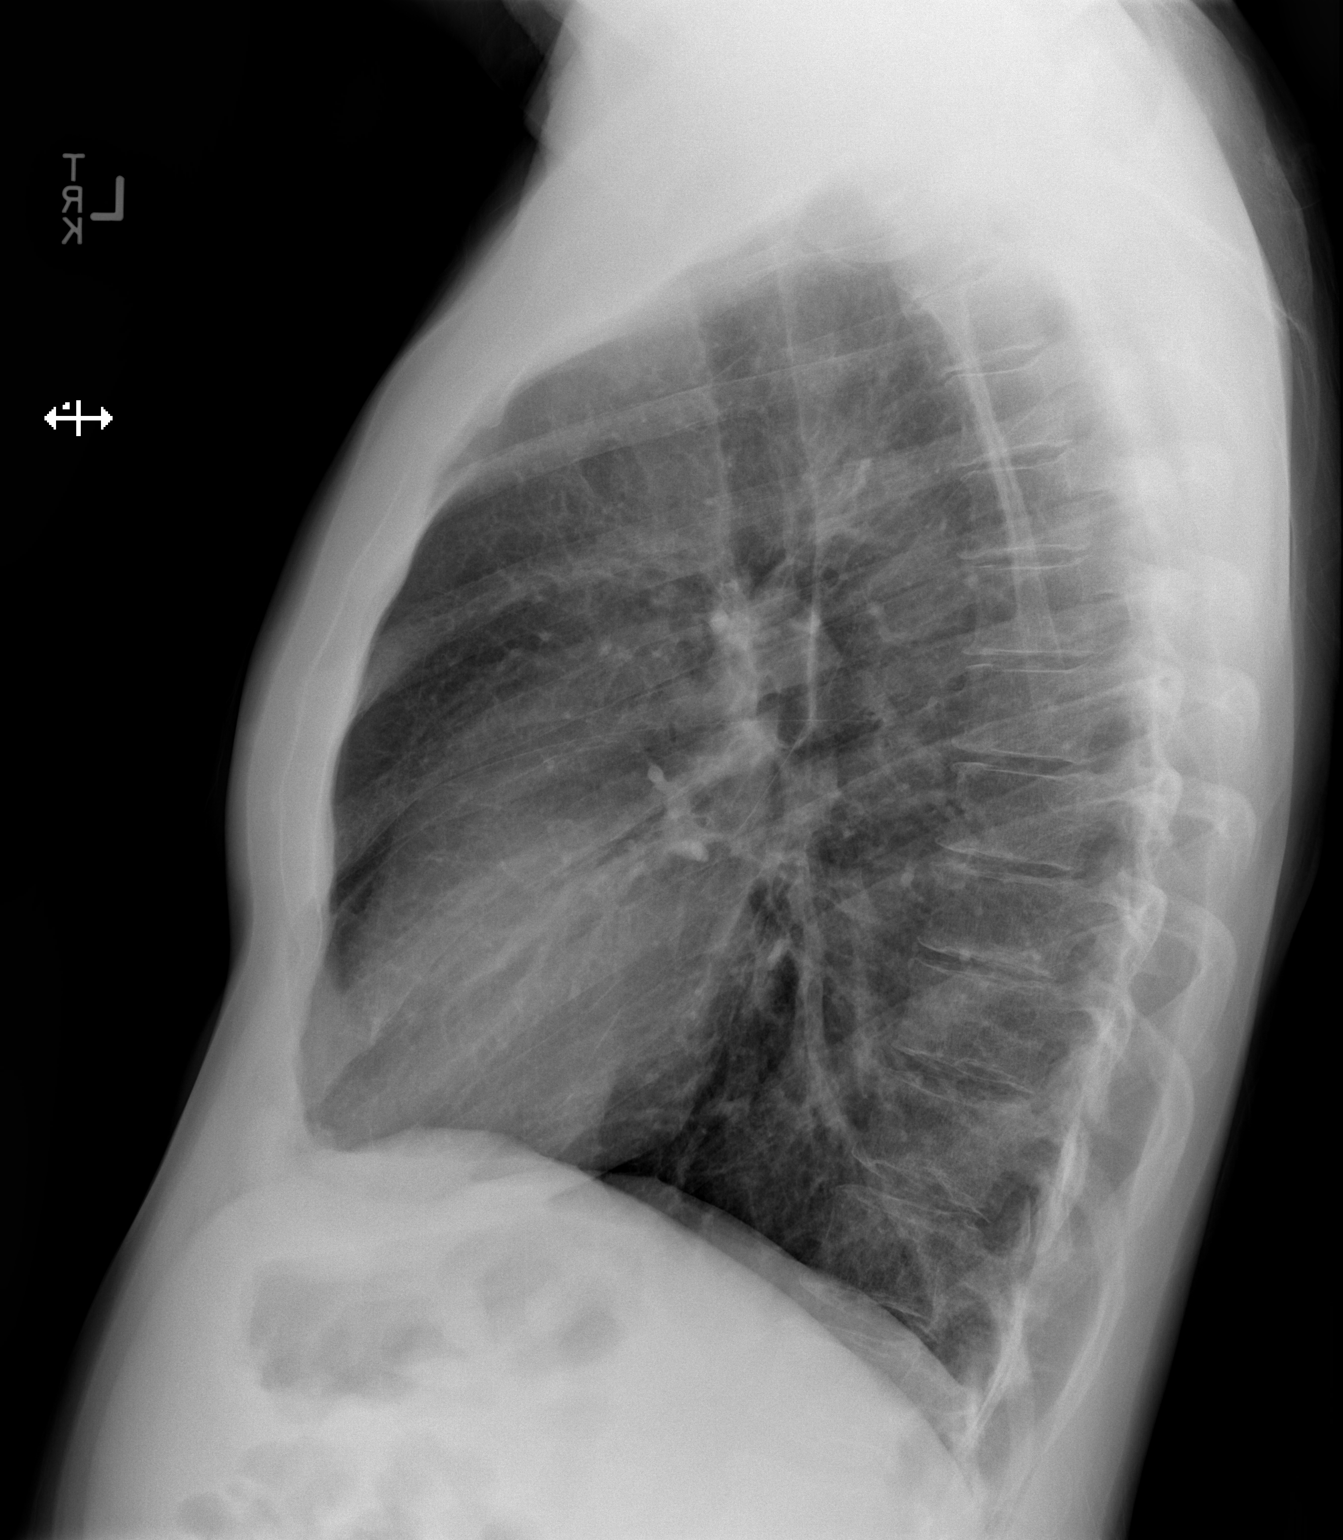

[2 of 2 positions shown; findings below may reference images not displayed]

FINDINGS: Mediastinum hilar structures normal. Lungs are clear. No pleural
effusion or pneumothorax. Heart size normal. No acute bony
abnormality.
IMPRESSION: No acute cardiopulmonary disease.

## 2020-03-27 IMAGING — DX DG HIP (WITH OR WITHOUT PELVIS) 1V PORT*L*
2 series · 2 of 2 positions shown · non-contrast
Comparison: Portable exam 5205 hours without priors for comparison

CLINICAL DATA: Post LEFT hip replacement

EXAM:
DG HIP (WITH OR WITHOUT PELVIS) 1V PORT LEFT

[pelvis]
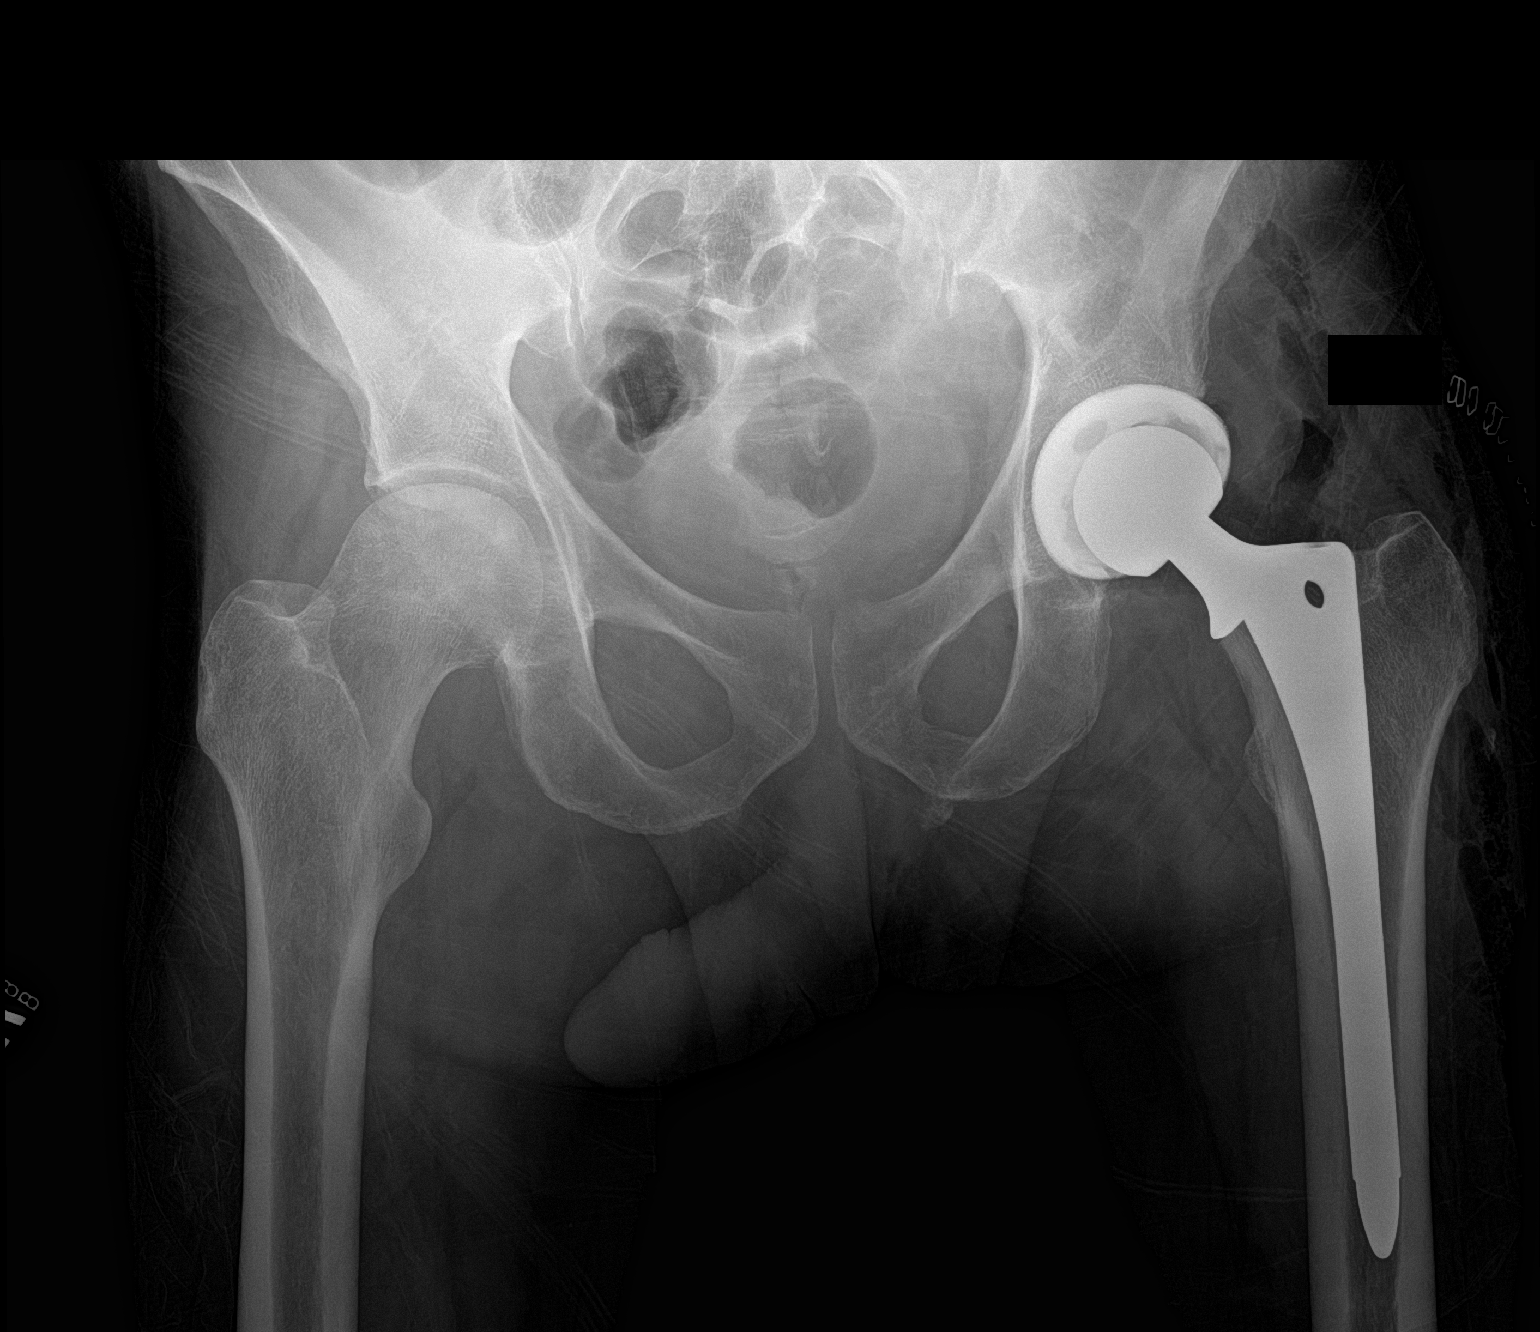

[hip]
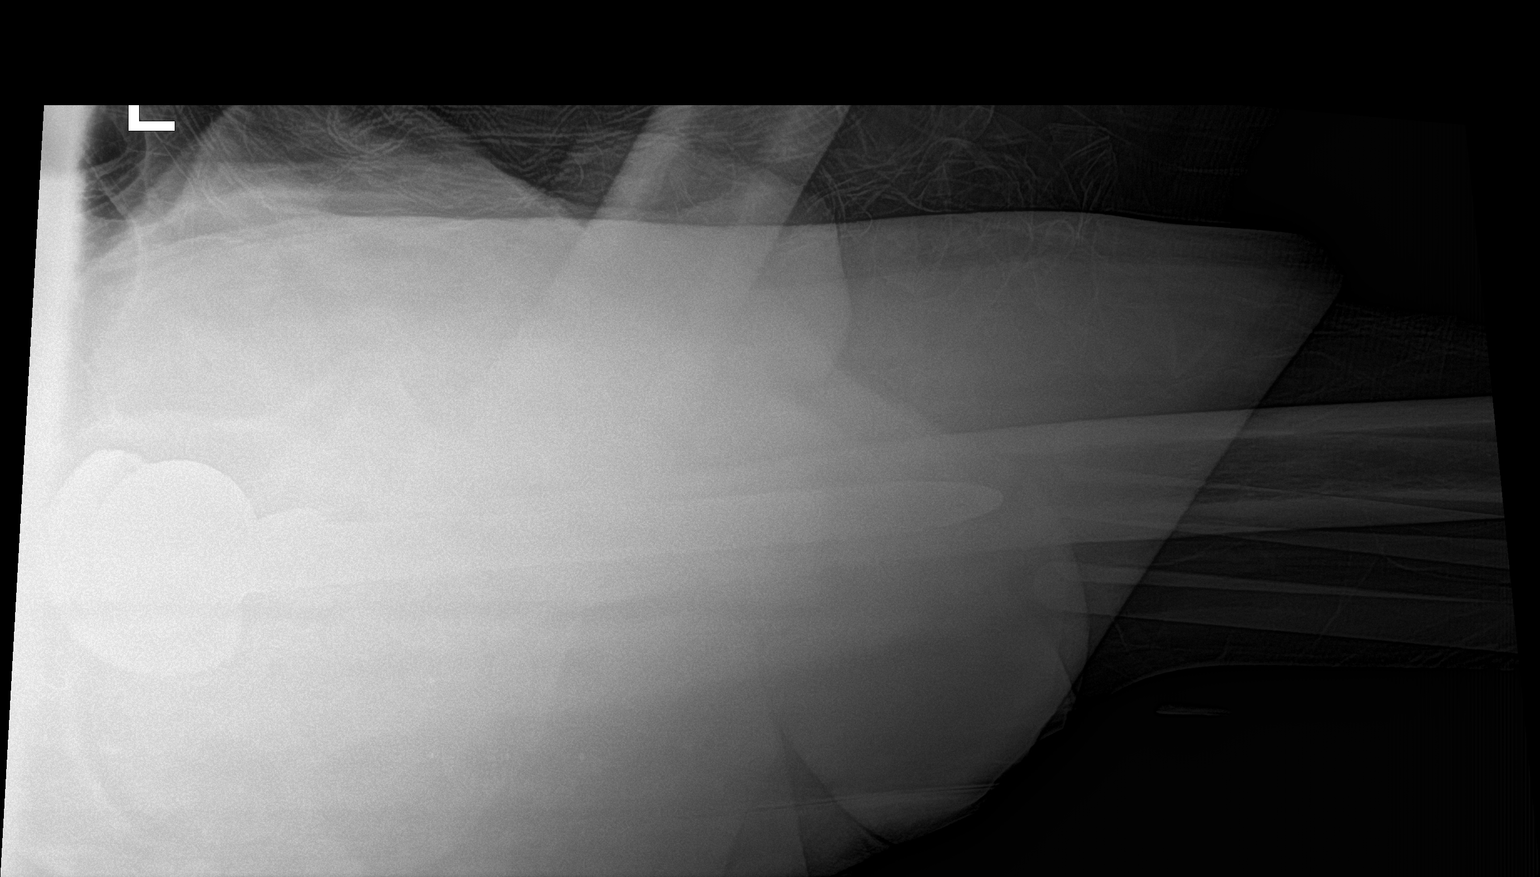

[2 of 2 positions shown; findings below may reference images not displayed]

FINDINGS: LEFT hip prosthesis identified.

Osseous mineralization grossly normal for technique.

No acute fracture or dislocation.

SI joints and RIGHT hip joint space preserved.

No periprosthetic lucency.
IMPRESSION: LEFT hip prosthesis without acute complication.

## 2020-06-27 DIAGNOSIS — L930 Discoid lupus erythematosus: Secondary | ICD-10-CM | POA: Diagnosis not present

## 2020-09-24 ENCOUNTER — Encounter (HOSPITAL_BASED_OUTPATIENT_CLINIC_OR_DEPARTMENT_OTHER): Payer: Self-pay | Admitting: *Deleted

## 2020-09-24 ENCOUNTER — Other Ambulatory Visit: Payer: Self-pay

## 2020-09-24 ENCOUNTER — Emergency Department (HOSPITAL_BASED_OUTPATIENT_CLINIC_OR_DEPARTMENT_OTHER)
Admission: EM | Admit: 2020-09-24 | Discharge: 2020-09-24 | Disposition: A | Discharge status: Home / Self Care | Payer: BC Managed Care – PPO | Attending: Emergency Medicine | Admitting: Emergency Medicine

## 2020-09-24 DIAGNOSIS — Z96643 Presence of artificial hip joint, bilateral: Secondary | ICD-10-CM | POA: Diagnosis not present

## 2020-09-24 DIAGNOSIS — W19XXXA Unspecified fall, initial encounter: Secondary | ICD-10-CM | POA: Diagnosis not present

## 2020-09-24 DIAGNOSIS — Z79899 Other long term (current) drug therapy: Secondary | ICD-10-CM | POA: Insufficient documentation

## 2020-09-24 DIAGNOSIS — I10 Essential (primary) hypertension: Secondary | ICD-10-CM | POA: Diagnosis not present

## 2020-09-24 DIAGNOSIS — J3489 Other specified disorders of nose and nasal sinuses: Secondary | ICD-10-CM | POA: Diagnosis not present

## 2020-09-24 DIAGNOSIS — M25562 Pain in left knee: Secondary | ICD-10-CM | POA: Diagnosis not present

## 2020-09-24 DIAGNOSIS — S01511A Laceration without foreign body of lip, initial encounter: Secondary | ICD-10-CM | POA: Insufficient documentation

## 2020-09-24 DIAGNOSIS — Z23 Encounter for immunization: Secondary | ICD-10-CM | POA: Diagnosis not present

## 2020-09-24 DIAGNOSIS — S82002A Unspecified fracture of left patella, initial encounter for closed fracture: Secondary | ICD-10-CM | POA: Diagnosis not present

## 2020-09-24 DIAGNOSIS — F17210 Nicotine dependence, cigarettes, uncomplicated: Secondary | ICD-10-CM | POA: Insufficient documentation

## 2020-09-24 DIAGNOSIS — S0993XA Unspecified injury of face, initial encounter: Secondary | ICD-10-CM | POA: Diagnosis not present

## 2020-09-24 MED ORDER — TETANUS-DIPHTH-ACELL PERTUSSIS 5-2.5-18.5 LF-MCG/0.5 IM SUSY
0.5000 mL | PREFILLED_SYRINGE | Freq: Once | INTRAMUSCULAR | Status: AC
  Administered 2020-09-24: 0.5 mL via INTRAMUSCULAR
  Filled 2020-09-24: qty 0.5

## 2020-09-24 MED ORDER — LIDOCAINE HCL (PF) 1 % IJ SOLN
5.0000 mL | Freq: Once | INTRAMUSCULAR | Status: AC
  Administered 2020-09-24: 5 mL
  Filled 2020-09-24: qty 5

## 2020-09-24 MED ORDER — CHLORHEXIDINE GLUCONATE 0.12 % MT SOLN: 15.0000 mL | Freq: Two times a day (BID) | OROMUCOSAL | 0 refills | Status: AC

## 2020-09-24 MED ORDER — CLINDAMYCIN HCL 150 MG PO CAPS: 300.0000 mg | ORAL_CAPSULE | Freq: Three times a day (TID) | ORAL | 0 refills | Status: AC
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
## 2020-09-24 NOTE — ED Triage Notes (Signed)
Fall last night.  Lower lip laceration and left knee injury. He went to [facility] and they did not want to suture due to time lapse.

## 2020-09-24 NOTE — Discharge Instructions (Addendum)
Patient advised Peridex swish and spit 2 times daily.  Advised immediate return for fevers purulent discharge redness pain or swelling.  Advised follow-up with his doctor within a week and return here in 7 days for suture removal.

## 2020-09-27 DIAGNOSIS — S82002A Unspecified fracture of left patella, initial encounter for closed fracture: Secondary | ICD-10-CM | POA: Diagnosis not present

## 2020-10-29 DIAGNOSIS — S82002D Unspecified fracture of left patella, subsequent encounter for closed fracture with routine healing: Secondary | ICD-10-CM | POA: Diagnosis not present

## 2020-10-29 DIAGNOSIS — M25551 Pain in right hip: Secondary | ICD-10-CM | POA: Diagnosis not present

## 2020-12-10 DIAGNOSIS — S82002D Unspecified fracture of left patella, subsequent encounter for closed fracture with routine healing: Secondary | ICD-10-CM | POA: Diagnosis not present

## 2020-12-19 DIAGNOSIS — L930 Discoid lupus erythematosus: Secondary | ICD-10-CM | POA: Diagnosis not present

## 2021-02-07 DIAGNOSIS — M329 Systemic lupus erythematosus, unspecified: Secondary | ICD-10-CM | POA: Diagnosis not present

## 2021-02-07 DIAGNOSIS — N529 Male erectile dysfunction, unspecified: Secondary | ICD-10-CM | POA: Diagnosis not present

## 2021-02-19 DIAGNOSIS — L930 Discoid lupus erythematosus: Secondary | ICD-10-CM | POA: Diagnosis not present

## 2021-02-19 DIAGNOSIS — R531 Weakness: Secondary | ICD-10-CM | POA: Diagnosis not present

## 2022-02-07 DIAGNOSIS — R21 Rash and other nonspecific skin eruption: Secondary | ICD-10-CM | POA: Diagnosis not present

## 2022-02-26 DIAGNOSIS — R21 Rash and other nonspecific skin eruption: Secondary | ICD-10-CM | POA: Diagnosis not present

## 2022-03-18 DIAGNOSIS — L568 Other specified acute skin changes due to ultraviolet radiation: Secondary | ICD-10-CM | POA: Diagnosis not present

## 2022-06-06 DIAGNOSIS — Z79899 Other long term (current) drug therapy: Secondary | ICD-10-CM | POA: Diagnosis not present

## 2022-06-06 DIAGNOSIS — L930 Discoid lupus erythematosus: Secondary | ICD-10-CM | POA: Diagnosis not present

## 2022-07-24 DIAGNOSIS — K219 Gastro-esophageal reflux disease without esophagitis: Secondary | ICD-10-CM | POA: Diagnosis not present

## 2022-07-24 DIAGNOSIS — L930 Discoid lupus erythematosus: Secondary | ICD-10-CM | POA: Diagnosis not present

## 2022-07-24 DIAGNOSIS — R1310 Dysphagia, unspecified: Secondary | ICD-10-CM | POA: Diagnosis not present

## 2022-09-17 DIAGNOSIS — R1319 Other dysphagia: Secondary | ICD-10-CM | POA: Diagnosis not present

## 2022-09-17 DIAGNOSIS — K219 Gastro-esophageal reflux disease without esophagitis: Secondary | ICD-10-CM | POA: Diagnosis not present

## 2022-09-30 DIAGNOSIS — K222 Esophageal obstruction: Secondary | ICD-10-CM | POA: Diagnosis not present

## 2022-09-30 DIAGNOSIS — K219 Gastro-esophageal reflux disease without esophagitis: Secondary | ICD-10-CM | POA: Diagnosis not present

## 2022-09-30 DIAGNOSIS — R1310 Dysphagia, unspecified: Secondary | ICD-10-CM | POA: Diagnosis not present

## 2022-09-30 DIAGNOSIS — K319 Disease of stomach and duodenum, unspecified: Secondary | ICD-10-CM | POA: Diagnosis not present

## 2022-09-30 DIAGNOSIS — K449 Diaphragmatic hernia without obstruction or gangrene: Secondary | ICD-10-CM | POA: Diagnosis not present

## 2022-09-30 DIAGNOSIS — K2970 Gastritis, unspecified, without bleeding: Secondary | ICD-10-CM | POA: Diagnosis not present

## 2022-10-09 DIAGNOSIS — L930 Discoid lupus erythematosus: Secondary | ICD-10-CM | POA: Diagnosis not present

## 2022-10-22 DIAGNOSIS — Z1212 Encounter for screening for malignant neoplasm of rectum: Secondary | ICD-10-CM | POA: Diagnosis not present

## 2022-10-22 DIAGNOSIS — Z1211 Encounter for screening for malignant neoplasm of colon: Secondary | ICD-10-CM | POA: Diagnosis not present

## 2023-01-09 DIAGNOSIS — L309 Dermatitis, unspecified: Secondary | ICD-10-CM | POA: Diagnosis not present

## 2023-02-17 DIAGNOSIS — M329 Systemic lupus erythematosus, unspecified: Secondary | ICD-10-CM | POA: Diagnosis not present

## 2023-02-17 DIAGNOSIS — M25511 Pain in right shoulder: Secondary | ICD-10-CM | POA: Diagnosis not present

## 2023-02-17 DIAGNOSIS — I10 Essential (primary) hypertension: Secondary | ICD-10-CM | POA: Diagnosis not present

## 2023-02-17 DIAGNOSIS — D649 Anemia, unspecified: Secondary | ICD-10-CM | POA: Diagnosis not present

## 2023-02-20 ENCOUNTER — Ambulatory Visit
Admission: RE | Admit: 2023-02-20 | Discharge: 2023-02-20 | Disposition: A | Discharge status: Home / Self Care | Payer: BC Managed Care – PPO | Source: Ambulatory Visit | Attending: Family Medicine | Admitting: Family Medicine

## 2023-02-20 ENCOUNTER — Other Ambulatory Visit: Payer: Self-pay | Admitting: Family Medicine

## 2023-02-20 DIAGNOSIS — M25511 Pain in right shoulder: Secondary | ICD-10-CM

## 2023-02-20 DIAGNOSIS — R936 Abnormal findings on diagnostic imaging of limbs: Secondary | ICD-10-CM | POA: Diagnosis not present

## 2023-02-27 DIAGNOSIS — M25511 Pain in right shoulder: Secondary | ICD-10-CM | POA: Diagnosis not present

## 2023-03-23 DIAGNOSIS — M75101 Unspecified rotator cuff tear or rupture of right shoulder, not specified as traumatic: Secondary | ICD-10-CM | POA: Diagnosis not present

## 2023-03-23 DIAGNOSIS — M87019 Idiopathic aseptic necrosis of unspecified shoulder: Secondary | ICD-10-CM | POA: Diagnosis not present

## 2023-03-23 DIAGNOSIS — R52 Pain, unspecified: Secondary | ICD-10-CM | POA: Diagnosis not present

## 2023-03-30 DIAGNOSIS — M25411 Effusion, right shoulder: Secondary | ICD-10-CM | POA: Diagnosis not present

## 2023-03-30 DIAGNOSIS — S46111A Strain of muscle, fascia and tendon of long head of biceps, right arm, initial encounter: Secondary | ICD-10-CM | POA: Diagnosis not present

## 2023-03-30 DIAGNOSIS — M75111 Incomplete rotator cuff tear or rupture of right shoulder, not specified as traumatic: Secondary | ICD-10-CM | POA: Diagnosis not present

## 2023-03-30 DIAGNOSIS — M778 Other enthesopathies, not elsewhere classified: Secondary | ICD-10-CM | POA: Diagnosis not present

## 2023-03-30 DIAGNOSIS — M24111 Other articular cartilage disorders, right shoulder: Secondary | ICD-10-CM | POA: Diagnosis not present

## 2023-03-30 DIAGNOSIS — S43431A Superior glenoid labrum lesion of right shoulder, initial encounter: Secondary | ICD-10-CM | POA: Diagnosis not present

## 2023-04-01 DIAGNOSIS — M87019 Idiopathic aseptic necrosis of unspecified shoulder: Secondary | ICD-10-CM | POA: Diagnosis not present

## 2023-04-01 DIAGNOSIS — M75101 Unspecified rotator cuff tear or rupture of right shoulder, not specified as traumatic: Secondary | ICD-10-CM | POA: Diagnosis not present

## 2023-04-03 DIAGNOSIS — M87019 Idiopathic aseptic necrosis of unspecified shoulder: Secondary | ICD-10-CM | POA: Diagnosis not present

## 2023-04-03 DIAGNOSIS — M75101 Unspecified rotator cuff tear or rupture of right shoulder, not specified as traumatic: Secondary | ICD-10-CM | POA: Diagnosis not present

## 2023-04-08 DIAGNOSIS — M25811 Other specified joint disorders, right shoulder: Secondary | ICD-10-CM | POA: Diagnosis not present

## 2023-04-08 DIAGNOSIS — M19011 Primary osteoarthritis, right shoulder: Secondary | ICD-10-CM | POA: Diagnosis not present

## 2023-04-08 DIAGNOSIS — Z01818 Encounter for other preprocedural examination: Secondary | ICD-10-CM | POA: Diagnosis not present

## 2023-04-08 DIAGNOSIS — M25411 Effusion, right shoulder: Secondary | ICD-10-CM | POA: Diagnosis not present

## 2023-04-16 DIAGNOSIS — M75101 Unspecified rotator cuff tear or rupture of right shoulder, not specified as traumatic: Secondary | ICD-10-CM | POA: Diagnosis not present

## 2023-04-22 DIAGNOSIS — R21 Rash and other nonspecific skin eruption: Secondary | ICD-10-CM | POA: Diagnosis not present

## 2023-04-22 DIAGNOSIS — M75101 Unspecified rotator cuff tear or rupture of right shoulder, not specified as traumatic: Secondary | ICD-10-CM | POA: Diagnosis not present

## 2023-04-22 DIAGNOSIS — M87019 Idiopathic aseptic necrosis of unspecified shoulder: Secondary | ICD-10-CM | POA: Diagnosis not present

## 2023-04-22 DIAGNOSIS — Z1330 Encounter for screening examination for mental health and behavioral disorders, unspecified: Secondary | ICD-10-CM | POA: Diagnosis not present

## 2023-05-01 DIAGNOSIS — R110 Nausea: Secondary | ICD-10-CM | POA: Diagnosis not present

## 2023-05-01 DIAGNOSIS — M87019 Idiopathic aseptic necrosis of unspecified shoulder: Secondary | ICD-10-CM | POA: Diagnosis not present

## 2023-05-01 DIAGNOSIS — R21 Rash and other nonspecific skin eruption: Secondary | ICD-10-CM | POA: Diagnosis not present

## 2023-05-06 DIAGNOSIS — I10 Essential (primary) hypertension: Secondary | ICD-10-CM | POA: Diagnosis not present

## 2023-05-06 DIAGNOSIS — Z01818 Encounter for other preprocedural examination: Secondary | ICD-10-CM | POA: Diagnosis not present

## 2023-05-06 DIAGNOSIS — D696 Thrombocytopenia, unspecified: Secondary | ICD-10-CM | POA: Diagnosis not present

## 2023-05-06 DIAGNOSIS — F17210 Nicotine dependence, cigarettes, uncomplicated: Secondary | ICD-10-CM | POA: Diagnosis not present

## 2023-05-06 DIAGNOSIS — L930 Discoid lupus erythematosus: Secondary | ICD-10-CM | POA: Diagnosis not present

## 2023-05-06 DIAGNOSIS — G8929 Other chronic pain: Secondary | ICD-10-CM | POA: Diagnosis not present

## 2023-05-06 DIAGNOSIS — F1090 Alcohol use, unspecified, uncomplicated: Secondary | ICD-10-CM | POA: Diagnosis not present

## 2023-05-06 DIAGNOSIS — Z01812 Encounter for preprocedural laboratory examination: Secondary | ICD-10-CM | POA: Diagnosis not present

## 2023-05-06 DIAGNOSIS — Z01810 Encounter for preprocedural cardiovascular examination: Secondary | ICD-10-CM | POA: Diagnosis not present

## 2023-05-06 DIAGNOSIS — M19011 Primary osteoarthritis, right shoulder: Secondary | ICD-10-CM | POA: Diagnosis not present

## 2023-05-21 DIAGNOSIS — I10 Essential (primary) hypertension: Secondary | ICD-10-CM | POA: Diagnosis not present

## 2023-05-21 DIAGNOSIS — M87021 Idiopathic aseptic necrosis of right humerus: Secondary | ICD-10-CM | POA: Diagnosis not present

## 2023-05-21 DIAGNOSIS — M87811 Other osteonecrosis, right shoulder: Secondary | ICD-10-CM | POA: Diagnosis not present

## 2023-05-21 DIAGNOSIS — Z471 Aftercare following joint replacement surgery: Secondary | ICD-10-CM | POA: Diagnosis not present

## 2023-05-21 DIAGNOSIS — Z7982 Long term (current) use of aspirin: Secondary | ICD-10-CM | POA: Diagnosis not present

## 2023-05-21 DIAGNOSIS — L930 Discoid lupus erythematosus: Secondary | ICD-10-CM | POA: Diagnosis not present

## 2023-05-21 DIAGNOSIS — Z880 Allergy status to penicillin: Secondary | ICD-10-CM | POA: Diagnosis not present

## 2023-05-21 DIAGNOSIS — F17200 Nicotine dependence, unspecified, uncomplicated: Secondary | ICD-10-CM | POA: Diagnosis not present

## 2023-05-21 DIAGNOSIS — Z96611 Presence of right artificial shoulder joint: Secondary | ICD-10-CM | POA: Diagnosis not present

## 2023-05-21 DIAGNOSIS — G8918 Other acute postprocedural pain: Secondary | ICD-10-CM | POA: Diagnosis not present

## 2023-05-21 DIAGNOSIS — M87011 Idiopathic aseptic necrosis of right shoulder: Secondary | ICD-10-CM | POA: Diagnosis not present

## 2023-05-21 DIAGNOSIS — M19011 Primary osteoarthritis, right shoulder: Secondary | ICD-10-CM | POA: Diagnosis not present

## 2023-05-21 DIAGNOSIS — Z79899 Other long term (current) drug therapy: Secondary | ICD-10-CM | POA: Diagnosis not present

## 2023-05-21 DIAGNOSIS — M65911 Unspecified synovitis and tenosynovitis, right shoulder: Secondary | ICD-10-CM | POA: Diagnosis not present

## 2023-05-22 DIAGNOSIS — F17200 Nicotine dependence, unspecified, uncomplicated: Secondary | ICD-10-CM | POA: Diagnosis not present

## 2023-05-22 DIAGNOSIS — M87811 Other osteonecrosis, right shoulder: Secondary | ICD-10-CM | POA: Diagnosis not present

## 2023-05-22 DIAGNOSIS — I10 Essential (primary) hypertension: Secondary | ICD-10-CM | POA: Diagnosis not present

## 2023-05-22 DIAGNOSIS — Z79899 Other long term (current) drug therapy: Secondary | ICD-10-CM | POA: Diagnosis not present

## 2023-05-22 DIAGNOSIS — L930 Discoid lupus erythematosus: Secondary | ICD-10-CM | POA: Diagnosis not present

## 2023-05-22 DIAGNOSIS — Z880 Allergy status to penicillin: Secondary | ICD-10-CM | POA: Diagnosis not present

## 2023-05-22 DIAGNOSIS — Z7982 Long term (current) use of aspirin: Secondary | ICD-10-CM | POA: Diagnosis not present

## 2023-05-25 DIAGNOSIS — M87019 Idiopathic aseptic necrosis of unspecified shoulder: Secondary | ICD-10-CM | POA: Diagnosis not present

## 2023-05-28 DIAGNOSIS — M87019 Idiopathic aseptic necrosis of unspecified shoulder: Secondary | ICD-10-CM | POA: Diagnosis not present

## 2023-06-02 DIAGNOSIS — M87019 Idiopathic aseptic necrosis of unspecified shoulder: Secondary | ICD-10-CM | POA: Diagnosis not present

## 2023-06-03 DIAGNOSIS — Z96611 Presence of right artificial shoulder joint: Secondary | ICD-10-CM | POA: Diagnosis not present

## 2023-06-04 DIAGNOSIS — M87019 Idiopathic aseptic necrosis of unspecified shoulder: Secondary | ICD-10-CM | POA: Diagnosis not present

## 2023-06-09 DIAGNOSIS — M87019 Idiopathic aseptic necrosis of unspecified shoulder: Secondary | ICD-10-CM | POA: Diagnosis not present

## 2023-06-11 DIAGNOSIS — M87019 Idiopathic aseptic necrosis of unspecified shoulder: Secondary | ICD-10-CM | POA: Diagnosis not present

## 2023-06-16 DIAGNOSIS — M87019 Idiopathic aseptic necrosis of unspecified shoulder: Secondary | ICD-10-CM | POA: Diagnosis not present

## 2023-06-18 DIAGNOSIS — M87019 Idiopathic aseptic necrosis of unspecified shoulder: Secondary | ICD-10-CM | POA: Diagnosis not present

## 2023-06-25 DIAGNOSIS — M87019 Idiopathic aseptic necrosis of unspecified shoulder: Secondary | ICD-10-CM | POA: Diagnosis not present

## 2023-06-29 DIAGNOSIS — M87019 Idiopathic aseptic necrosis of unspecified shoulder: Secondary | ICD-10-CM | POA: Diagnosis not present

## 2023-07-01 DIAGNOSIS — M87019 Idiopathic aseptic necrosis of unspecified shoulder: Secondary | ICD-10-CM | POA: Diagnosis not present

## 2023-07-07 DIAGNOSIS — M87019 Idiopathic aseptic necrosis of unspecified shoulder: Secondary | ICD-10-CM | POA: Diagnosis not present

## 2023-07-09 DIAGNOSIS — M87019 Idiopathic aseptic necrosis of unspecified shoulder: Secondary | ICD-10-CM | POA: Diagnosis not present

## 2023-07-13 DIAGNOSIS — M87019 Idiopathic aseptic necrosis of unspecified shoulder: Secondary | ICD-10-CM | POA: Diagnosis not present

## 2023-07-20 DIAGNOSIS — M87019 Idiopathic aseptic necrosis of unspecified shoulder: Secondary | ICD-10-CM | POA: Diagnosis not present

## 2023-07-22 DIAGNOSIS — M87019 Idiopathic aseptic necrosis of unspecified shoulder: Secondary | ICD-10-CM | POA: Diagnosis not present

## 2023-07-29 DIAGNOSIS — M67472 Ganglion, left ankle and foot: Secondary | ICD-10-CM | POA: Diagnosis not present

## 2023-07-29 DIAGNOSIS — N529 Male erectile dysfunction, unspecified: Secondary | ICD-10-CM | POA: Diagnosis not present

## 2023-07-29 DIAGNOSIS — M87019 Idiopathic aseptic necrosis of unspecified shoulder: Secondary | ICD-10-CM | POA: Diagnosis not present

## 2023-07-31 DIAGNOSIS — M87019 Idiopathic aseptic necrosis of unspecified shoulder: Secondary | ICD-10-CM | POA: Diagnosis not present

## 2023-08-06 ENCOUNTER — Ambulatory Visit: Payer: BC Managed Care – PPO

## 2023-08-06 ENCOUNTER — Other Ambulatory Visit: Payer: Self-pay

## 2023-08-06 ENCOUNTER — Ambulatory Visit: Payer: BC Managed Care – PPO | Admitting: Podiatry

## 2023-08-06 ENCOUNTER — Encounter: Payer: Self-pay | Admitting: Podiatry

## 2023-08-06 DIAGNOSIS — M7989 Other specified soft tissue disorders: Secondary | ICD-10-CM | POA: Diagnosis not present

## 2023-08-06 DIAGNOSIS — M79672 Pain in left foot: Secondary | ICD-10-CM
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
## 2023-08-24 DIAGNOSIS — Z96611 Presence of right artificial shoulder joint: Secondary | ICD-10-CM | POA: Diagnosis not present

## 2023-08-26 DIAGNOSIS — M87019 Idiopathic aseptic necrosis of unspecified shoulder: Secondary | ICD-10-CM | POA: Diagnosis not present

## 2023-08-27 DIAGNOSIS — M67479 Ganglion, unspecified ankle and foot: Secondary | ICD-10-CM | POA: Diagnosis not present

## 2023-08-28 ENCOUNTER — Telehealth: Payer: Self-pay

## 2023-08-28 NOTE — Telephone Encounter (Signed)
 Shay with [facility] called and requested that our office withdraw insurance authorization for MRI so they can order the MRI. I consulted with Dr. Joya via secure chat and she was okay with this. Since patient had not had MRI done I was able to withdraw request.  T returned call to [facility] and informed her that authorization was withdrawn. She verbalized her understanding.

## 2023-09-08 DIAGNOSIS — M87019 Idiopathic aseptic necrosis of unspecified shoulder: Secondary | ICD-10-CM | POA: Diagnosis not present

## 2023-09-14 DIAGNOSIS — M67472 Ganglion, left ankle and foot: Secondary | ICD-10-CM | POA: Diagnosis not present

## 2023-09-14 DIAGNOSIS — M87875 Other osteonecrosis, left foot: Secondary | ICD-10-CM | POA: Diagnosis not present

## 2023-09-14 DIAGNOSIS — M19072 Primary osteoarthritis, left ankle and foot: Secondary | ICD-10-CM | POA: Diagnosis not present

## 2023-09-17 DIAGNOSIS — M67479 Ganglion, unspecified ankle and foot: Secondary | ICD-10-CM | POA: Diagnosis not present

## 2023-09-17 DIAGNOSIS — M8700 Idiopathic aseptic necrosis of unspecified bone: Secondary | ICD-10-CM | POA: Diagnosis not present

## 2023-09-29 DIAGNOSIS — Z79899 Other long term (current) drug therapy: Secondary | ICD-10-CM | POA: Diagnosis not present

## 2023-09-29 DIAGNOSIS — D531 Other megaloblastic anemias, not elsewhere classified: Secondary | ICD-10-CM | POA: Diagnosis not present

## 2023-09-29 DIAGNOSIS — L930 Discoid lupus erythematosus: Secondary | ICD-10-CM | POA: Diagnosis not present

## 2023-09-29 DIAGNOSIS — I10 Essential (primary) hypertension: Secondary | ICD-10-CM | POA: Diagnosis not present

## 2023-10-01 DIAGNOSIS — M67479 Ganglion, unspecified ankle and foot: Secondary | ICD-10-CM | POA: Diagnosis not present

## 2023-10-07 DIAGNOSIS — L930 Discoid lupus erythematosus: Secondary | ICD-10-CM | POA: Diagnosis not present

## 2023-10-07 DIAGNOSIS — I10 Essential (primary) hypertension: Secondary | ICD-10-CM | POA: Diagnosis not present

## 2023-10-07 DIAGNOSIS — R2242 Localized swelling, mass and lump, left lower limb: Secondary | ICD-10-CM | POA: Diagnosis not present

## 2023-10-07 DIAGNOSIS — M67479 Ganglion, unspecified ankle and foot: Secondary | ICD-10-CM | POA: Diagnosis not present

## 2023-10-07 DIAGNOSIS — M67472 Ganglion, left ankle and foot: Secondary | ICD-10-CM | POA: Diagnosis not present

## 2023-10-07 DIAGNOSIS — K219 Gastro-esophageal reflux disease without esophagitis: Secondary | ICD-10-CM | POA: Diagnosis not present

## 2023-10-07 DIAGNOSIS — Z7982 Long term (current) use of aspirin: Secondary | ICD-10-CM | POA: Diagnosis not present

## 2023-10-07 DIAGNOSIS — Z79899 Other long term (current) drug therapy: Secondary | ICD-10-CM | POA: Diagnosis not present

## 2023-10-07 DIAGNOSIS — M7989 Other specified soft tissue disorders: Secondary | ICD-10-CM | POA: Diagnosis not present

## 2023-10-07 DIAGNOSIS — Z880 Allergy status to penicillin: Secondary | ICD-10-CM | POA: Diagnosis not present

## 2023-10-07 DIAGNOSIS — M65871 Other synovitis and tenosynovitis, right ankle and foot: Secondary | ICD-10-CM | POA: Diagnosis not present

## 2023-10-22 DIAGNOSIS — M61571 Other ossification of muscle, right ankle and foot: Secondary | ICD-10-CM | POA: Diagnosis not present

## 2023-11-05 DIAGNOSIS — M67479 Ganglion, unspecified ankle and foot: Secondary | ICD-10-CM | POA: Diagnosis not present

## 2023-11-05 DIAGNOSIS — T148XXA Other injury of unspecified body region, initial encounter: Secondary | ICD-10-CM | POA: Diagnosis not present
